# Patient Record
Sex: Male | Born: 1980 | Race: White | Hispanic: No | Marital: Married | Smoking: Never smoker
Health system: Southern US, Community
[De-identification: ages and names within clinical notes are randomized; demographics above are authoritative.]

## PROBLEM LIST (undated history)

## (undated) DIAGNOSIS — IMO0002 Reserved for concepts with insufficient information to code with codable children: Secondary | ICD-10-CM

## (undated) DIAGNOSIS — I10 Essential (primary) hypertension: Secondary | ICD-10-CM

## (undated) HISTORY — PX: CERVICAL FUSION: SHX112

## (undated) HISTORY — PX: OTHER SURGICAL HISTORY: SHX169

## (undated) HISTORY — PX: WISDOM TOOTH EXTRACTION: SHX21

## (undated) HISTORY — PX: TONSILLECTOMY: SUR1361

---

## 2004-09-05 ENCOUNTER — Ambulatory Visit (HOSPITAL_COMMUNITY): Admission: RE | Admit: 2004-09-05 | Discharge: 2004-09-05 | Payer: Self-pay | Admitting: Family Medicine

## 2004-09-16 ENCOUNTER — Ambulatory Visit (HOSPITAL_COMMUNITY): Admission: RE | Admit: 2004-09-16 | Discharge: 2004-09-17 | Payer: Self-pay | Admitting: Neurosurgery

## 2008-06-01 ENCOUNTER — Ambulatory Visit (HOSPITAL_COMMUNITY): Admission: RE | Admit: 2008-06-01 | Discharge: 2008-06-01 | Payer: Self-pay | Admitting: Family Medicine

## 2008-06-11 ENCOUNTER — Ambulatory Visit (HOSPITAL_COMMUNITY): Admission: RE | Admit: 2008-06-11 | Discharge: 2008-06-11 | Payer: Self-pay | Admitting: Family Medicine

## 2010-05-27 NOTE — Op Note (Signed)
Isaiah, Hicks NO.:  [id number]   MEDICAL RECORD NO.:  [id number]          PATIENT TYPE:  OIB   LOCATION:  3017                         FACILITY:  [facility]   PHYSICIAN:  Danae Orleans. Venetia Maxon, M.D.  DATE OF BIRTH:  09-07-1980   DATE OF PROCEDURE:  09/16/2004  DATE OF DISCHARGE:                                 OPERATIVE REPORT   PREOPERATIVE DIAGNOSIS:  Herniated cervical disc with cervical spondylosis,  cervical myelopathy, cervical stenosis, and cervical radiculopathy, C5-C6  level.   POSTOPERATIVE DIAGNOSIS:  Herniated cervical disc with cervical spondylosis,  cervical myelopathy, cervical stenosis, and cervical radiculopathy, C5-C6  level.   PROCEDURE:  Anterior cervical decompression and fusion C5-C6 with PEEK  interbody cage and morselized bone autograft with anterior cervical plate.   SURGEON:  Danae Orleans. Venetia Maxon, M.D.   ASSISTANT:  Stefani Dama, M.D.   ANESTHESIA:  General endotracheal anesthesia.   ESTIMATED BLOOD LOSS:  Minimal.   COMPLICATIONS:  None.   DISPOSITION:  Recovery room.   INDICATIONS FOR PROCEDURE:  Isaiah Hicks is a 30 year old young man with  significant cervical disc herniation at C5-C6 with a right sided cord  compression and C6 nerve root symptoms.  He has significant cervical  stenosis at this level with cord compression.  He has hand intrinsic  weakness and evidence of a myelopathy on examination.  It was elected to  take him to surgery for anterior cervical decompression and fusion at the  affected level.   DESCRIPTION OF PROCEDURE:  Isaiah Hicks was brought to the operating room.  Following satisfactory uncomplicated induction of general endotracheal  anesthesia and placement of intravenous  lines, the patient was placed in a  supine position on the operating table.  His neck was placed in slight  extension.  He was placed in 10 pounds of halter traction.  His anterior  neck was then prepped and draped in the usual  sterile fashion.  The area of  planned incision was infiltrated with 0.25% Marcaine and 0.5% lidocaine with  1:200,000 epinephrine.  An incision was made from the midline to the  anterior border of the sternocleidomastoid muscle and carried sharply  through the platysmal layer.  Subplatysmal dissection was performed exposing  the anterior border of the sternocleidomastoid muscle using blunt  dissection.  The carotid sheath was kept lateral, trachea and esophagus kept  medial, and the anterior cervical spine was identified.  A bent spinal  needle was placed in what was felt to be the C5-C6 level and this was  confirmed on interoperative x-ray.  Subsequently, fairly large ventral  osteophytes were removed with a Leksell rongeur.  The interspace was incised  with a 15 blade and disc material was removed in a piecemeal fashion.  Prior  to doing so, the longus colli muscles were taken down from the anterior  cervical spine from the C5 through C6 levels bilaterally using  electrocautery and Key elevator and a self-retaining Shadowline retractor  was placed to facilitate exposure.  The disc space spreader was placed and  the microscope was brought onto the field.  Using high powered  microscopic  visualization, the endplates of C5 and C6 were decorticated using a high  speed drill and large osteophytes were removed with a variety of 2 and 3 mm  Kerrison rongeurs.  There was a soft disc herniation on the right which was  causing compression of the spinal cord and deflecting the posterior  longitudinal ligament into the spinal cord dura.  This was decompressed with  resulting significant decompression of the C6 nerve root, neural foramen,  and the spinal cord dura on the right side.  A similar decompression was  performed to the left side of midline with resulting decompression of the  spinal cord dura on this side, as well.  Hemostasis was obtained with  Gelfoam soaked in thrombin.  After trial  sizing of the 7 mm trial spacer, a  7 mm PEEK interbody cage was selected, packed with morselized bone  autograft, which was obtained from the drillings of the endplates, inserted  into the interspace, and counter sunk appropriately.  Subsequently, a 14 mm  anterior cervical plate was selected and affixed to the anterior cervical  spine using 14 mm variable angle screws, two at C5 and two at C6.  All  screws had excellent purchase.  Prior to placing the plate, the traction  weight was removed.  The locking mechanisms were engaged.  The final x-ray  demonstrated well positioned interbody cage and anterior cervical plate.  The soft tissues were inspected and found to be in good repair.  Hemostasis  was assured.  The wound was copiously irrigated with Bacitracin saline.  The  platysmal layer was then closed with 3-0 Vicryl sutures and the skin edges  were reapproximated with interrupted 3-0 Vicryl subcuticular stitch.  The  wound was dressed with Dermabond.  The patient was extubated in the  operating room and taken to the recovery room in stable condition having  tolerated the operation well.  Counts were correct at the end of the case.      Danae Orleans. Venetia Maxon, M.D.  Electronically Signed     JDS/MEDQ  D:  09/16/2004  T:  09/16/2004  Job:  [id number]

## 2010-08-08 ENCOUNTER — Emergency Department (HOSPITAL_COMMUNITY)
Admission: EM | Admit: 2010-08-08 | Discharge: 2010-08-08 | Disposition: A | Discharge status: Home / Self Care | Payer: 59 | Attending: Emergency Medicine | Admitting: Emergency Medicine

## 2010-08-08 ENCOUNTER — Emergency Department (HOSPITAL_COMMUNITY): Payer: 59

## 2010-08-08 DIAGNOSIS — N5089 Other specified disorders of the male genital organs: Secondary | ICD-10-CM | POA: Insufficient documentation

## 2010-08-08 DIAGNOSIS — N509 Disorder of male genital organs, unspecified: Secondary | ICD-10-CM | POA: Insufficient documentation

## 2010-08-08 DIAGNOSIS — N453 Epididymo-orchitis: Secondary | ICD-10-CM | POA: Insufficient documentation

## 2010-08-08 LAB — URINALYSIS, ROUTINE W REFLEX MICROSCOPIC
Glucose, UA: NEGATIVE mg/dL
Hgb urine dipstick: NEGATIVE
Ketones, ur: NEGATIVE mg/dL
Leukocytes, UA: NEGATIVE
Nitrite: NEGATIVE
Urobilinogen, UA: 1 mg/dL (ref 0.0–1.0)

## 2010-08-09 LAB — URINE CULTURE

## 2011-10-14 ENCOUNTER — Emergency Department (HOSPITAL_COMMUNITY)
Admission: EM | Admit: 2011-10-14 | Discharge: 2011-10-14 | Disposition: A | Discharge status: Home / Self Care | Payer: 59 | Attending: Emergency Medicine | Admitting: Emergency Medicine

## 2011-10-14 ENCOUNTER — Encounter (HOSPITAL_COMMUNITY): Payer: Self-pay | Admitting: *Deleted

## 2011-10-14 DIAGNOSIS — Z981 Arthrodesis status: Secondary | ICD-10-CM | POA: Insufficient documentation

## 2011-10-14 DIAGNOSIS — M545 Low back pain, unspecified: Secondary | ICD-10-CM | POA: Insufficient documentation

## 2011-10-14 DIAGNOSIS — Z888 Allergy status to other drugs, medicaments and biological substances status: Secondary | ICD-10-CM | POA: Insufficient documentation

## 2011-10-14 HISTORY — DX: Reserved for concepts with insufficient information to code with codable children: IMO0002

## 2011-10-14 MED ORDER — PREDNISONE 20 MG PO TABS
60.0000 mg | ORAL_TABLET | Freq: Once | ORAL | Status: AC
  Administered 2011-10-14: 60 mg via ORAL
  Filled 2011-10-14: qty 3

## 2011-10-14 MED ORDER — OXYCODONE-ACETAMINOPHEN 5-325 MG PO TABS: ORAL_TABLET | ORAL | Status: DC

## 2011-10-14 MED ORDER — OXYCODONE-ACETAMINOPHEN 5-325 MG PO TABS
1.0000 | ORAL_TABLET | Freq: Once | ORAL | Status: AC
  Administered 2011-10-14: 1 via ORAL
  Filled 2011-10-14: qty 1

## 2011-10-14 MED ORDER — PREDNISONE 50 MG PO TABS: 50.0000 mg | ORAL_TABLET | Freq: Every day | ORAL | Status: DC

## 2011-10-14 NOTE — ED Provider Notes (Signed)
History     CSN: [id number]  Arrival date & time 10/14/11  1943   First MD Initiated Contact with Patient 10/14/11 2028      Chief Complaint  Patient presents with  . Back Pain    (Consider location/radiation/quality/duration/timing/severity/associated sxs/prior treatment) HPI Comments: Pt was bending over and sneezed.  He had an immediate sharp pain in the R lower back that radiates to R buttock.  Called [facility] b/c he is a pt of dr. Ethelene Hal. Dr. Shon Baton returned the call and told him to come to the ED for pain meds and they would see him in the office on Monday.  Patient is a 31 y.o. male presenting with back pain. The history is provided by the patient. No language interpreter was used.  Back Pain  This is a chronic problem. The current episode started less than 1 hour ago. The problem occurs constantly. The problem has not changed since onset.The pain is present in the lumbar spine and gluteal region. The quality of the pain is described as shooting. The pain is severe. The symptoms are aggravated by bending and twisting. The pain is the same all the time. Pertinent negatives include no fever, no numbness, no bowel incontinence, no perianal numbness, no bladder incontinence, no dysuria, no pelvic pain, no leg pain, no paresthesias, no paresis, no tingling and no weakness.    Past Medical History  Diagnosis Date  . Degenerative disc disease     Past Surgical History  Procedure Date  . C5-6 fusin   . Cervical fusion     c 5 and c 6    History reviewed. No pertinent family history.  History  Substance Use Topics  . Smoking status: Never Smoker   . Smokeless tobacco: Not on file  . Alcohol Use: No      Review of Systems  Constitutional: Negative for fever and chills.  Gastrointestinal: Negative for bowel incontinence.  Genitourinary: Negative for bladder incontinence, dysuria and pelvic pain.  Musculoskeletal: Positive for back pain.  Neurological: Negative for  tingling, weakness, numbness and paresthesias.  All other systems reviewed and are negative.    Allergies  Zithranol  Home Medications   Current Outpatient Rx  Name Route Sig Dispense Refill  . OXYCODONE-ACETAMINOPHEN 5-325 MG PO TABS  One tab po q 4-6 hrs prn pain 15 tablet 0  . PREDNISONE 50 MG PO TABS Oral Take 1 tablet (50 mg total) by mouth daily. 5 tablet 0    BP 139/78  Pulse 75  Temp 98 F (36.7 C) (Oral)  Resp 20  Ht 6' (1.829 m)  Wt 220 lb (99.791 kg)  BMI 29.84 kg/m2  SpO2 100%  Physical Exam  Nursing note and vitals reviewed. Constitutional: He is oriented to person, place, and time. He appears well-developed and well-nourished.  HENT:  Head: Normocephalic and atraumatic.  Eyes: EOM are normal.  Neck: Normal range of motion.  Cardiovascular: Normal rate, regular rhythm, normal heart sounds and intact distal pulses.   Pulmonary/Chest: Effort normal and breath sounds normal. No respiratory distress.  Abdominal: Soft. He exhibits no distension. There is no tenderness.  Musculoskeletal: He exhibits tenderness.       Lumbar back: He exhibits decreased range of motion, tenderness and pain. He exhibits no bony tenderness.       Back:  Neurological: He is alert and oriented to person, place, and time.  Skin: Skin is warm and dry.  Psychiatric: He has a normal mood and affect. Judgment  normal.    ED Course  Procedures (including critical care time)  Labs Reviewed - No data to display No results found.   1. Acute lumbar back pain       MDM  rx-percocet, 15 rx-prednisone 50 mg x 5 days. F/u with dr. Johnney Killian, PA 10/14/11 2051

## 2011-10-14 NOTE — ED Notes (Signed)
Pt presents with chronic low back pain, worsening in past 2 days. Pt denies recent injury. Denies bowel/bladder dysfunction. Pt has appointment with PCP on Monday.

## 2011-10-14 NOTE — ED Notes (Addendum)
Pt bent over to pick up shampoo bottle and sneezed which caused lower back pain. Pt spoke with Dr. Shon Baton, his on-call orthopedist for Dr. Ethelene Hal. They instructed him to go to an urgent care for pain relief until he can see them on Monday.

## 2011-10-15 NOTE — ED Provider Notes (Signed)
Medical screening examination/treatment/procedure(s) were performed by non-physician practitioner and as supervising physician I was immediately available for consultation/collaboration.   Glender Augusta L Marquesa Rath, MD 10/15/11 0748 

## 2013-10-23 ENCOUNTER — Ambulatory Visit: Payer: Self-pay | Admitting: Podiatry

## 2013-12-22 ENCOUNTER — Telehealth: Payer: Self-pay | Admitting: Family Medicine

## 2013-12-22 NOTE — Telephone Encounter (Signed)
Patient not seen here in over 2 years

## 2013-12-22 NOTE — Telephone Encounter (Deleted)
Yes 10am per Dr Lorin PicketScott

## 2013-12-22 NOTE — Telephone Encounter (Signed)
tues am rolling 100 mL

## 2013-12-22 NOTE — Telephone Encounter (Signed)
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

## 2013-12-22 NOTE — Telephone Encounter (Signed)
appt for Tuesday AM?

## 2013-12-22 NOTE — Telephone Encounter (Addendum)
Appt for Friday am per Dr Lorin PicketScott

## 2013-12-22 NOTE — Telephone Encounter (Signed)
appt made

## 2013-12-26 ENCOUNTER — Ambulatory Visit (INDEPENDENT_AMBULATORY_CARE_PROVIDER_SITE_OTHER): Payer: 59 | Admitting: Family Medicine

## 2013-12-26 ENCOUNTER — Encounter: Payer: Self-pay | Admitting: Family Medicine

## 2013-12-26 VITALS — BP 136/84 | HR 76 | Ht 72.0 in | Wt 243.0 lb

## 2013-12-26 DIAGNOSIS — Z111 Encounter for screening for respiratory tuberculosis: Secondary | ICD-10-CM

## 2013-12-26 DIAGNOSIS — Z0000 Encounter for general adult medical examination without abnormal findings: Secondary | ICD-10-CM

## 2013-12-26 NOTE — Progress Notes (Signed)
   Subjective:    Patient ID: Isaiah Hicks, male    DOB: 07/12/1980, 33 y.o.   MRN: [id number]  HPI The patient comes in today for a wellness visit.  A review of their health history was completed.  A review of medications was also completed.  Any needed refills: NO  Eating habits: Sometimes tries to be health conscious  Falls/  MVA accidents in past few months: NO  Regular exercise: Some  Specialist pt sees on regular basis: NO  Preventative health issues were discussed.   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 I do not feel this patient needs any additional testing.  TB test was applied  Patient was cautioned to eat healthy stay physically active try to bring his weight down he was also recommended to check his cholesterol and sugar in the near future

## 2013-12-26 NOTE — Addendum Note (Signed)
Addended byOneal Deputy: Taylan Marez D on: 12/26/2013 03:16 PM   Modules accepted: Orders

## 2013-12-29 ENCOUNTER — Other Ambulatory Visit: Payer: Self-pay | Admitting: *Deleted

## 2013-12-29 ENCOUNTER — Ambulatory Visit (HOSPITAL_COMMUNITY)
Admission: RE | Admit: 2013-12-29 | Discharge: 2013-12-29 | Disposition: A | Discharge status: Home / Self Care | Payer: 59 | Source: Ambulatory Visit | Attending: Nurse Practitioner | Admitting: Nurse Practitioner

## 2013-12-29 DIAGNOSIS — Z87891 Personal history of nicotine dependence: Secondary | ICD-10-CM | POA: Diagnosis not present

## 2013-12-29 DIAGNOSIS — R05 Cough: Secondary | ICD-10-CM | POA: Insufficient documentation

## 2013-12-29 DIAGNOSIS — R7611 Nonspecific reaction to tuberculin skin test without active tuberculosis: Secondary | ICD-10-CM | POA: Diagnosis present

## 2013-12-29 DIAGNOSIS — R6889 Other general symptoms and signs: Secondary | ICD-10-CM | POA: Insufficient documentation

## 2013-12-29 DIAGNOSIS — Z111 Encounter for screening for respiratory tuberculosis: Secondary | ICD-10-CM

## 2013-12-29 LAB — TB SKIN TEST: TB Skin Test: POSITIVE

## 2013-12-30 ENCOUNTER — Encounter: Payer: Self-pay | Admitting: Family Medicine

## 2013-12-30 LAB — LIPID PANEL
CHOL/HDL RATIO: 5.8 ratio
CHOLESTEROL: 216 mg/dL — AB (ref 0–200)
HDL: 37 mg/dL — ABNORMAL LOW (ref >39)
LDL Cholesterol: 151 mg/dL — ABNORMAL HIGH (ref 0–99)
TRIGLYCERIDES: 138 mg/dL (ref <150)
VLDL: 28 mg/dL (ref 0–40)

## 2013-12-30 LAB — GLUCOSE, RANDOM: GLUCOSE: 92 mg/dL (ref 70–99)

## 2014-09-18 ENCOUNTER — Encounter: Payer: Self-pay | Admitting: Family Medicine

## 2014-09-18 ENCOUNTER — Ambulatory Visit (INDEPENDENT_AMBULATORY_CARE_PROVIDER_SITE_OTHER): Payer: 59 | Admitting: Family Medicine

## 2014-09-18 VITALS — BP 128/82 | Ht 72.0 in | Wt 242.6 lb

## 2014-09-18 DIAGNOSIS — M5441 Lumbago with sciatica, right side: Secondary | ICD-10-CM

## 2014-09-18 NOTE — Progress Notes (Signed)
   Subjective:    Patient ID: Isaiah Hicks, male    DOB: 19-Nov-1980, 34 y.o.   MRN: [id number]  HPI Patient arrives with c/o back pain for years currently using mobic. Patient relates significant lower right back pain radiates into the right leg this been going on for good couple years progressively worse over the past 4 months he is try stretching exercises core exercises as well as anti-inflammatory without any success. He is also seen orthopedic doctor who did plain x-rays told him he had degenerative disc disease and multiple injections which did not help. Patient states the pain is making difficult for him to work. He also relates the pain wakes him up at night as well.  Patient also relates groin pain on the left side which is intense aches goes from the rectum into the lower pelvis testicular region. He states that he saw the urologist and evaluation of alignment told him that he did not feel it was due to any urologic issues felt that it was due to an impingement nerve in his back  Patient does have a history of herniated disc in his neck  Review of Systems He relates burning pain discomfort down the right leg intermittent numbness tingling. Relates low back pain on right side denies abdominal pain vomiting diarrhea chest pain or shortness breath    Objective:   Physical Exam  Lungs clear heart trigger pulse normal abdomen soft positive straight leg raise on the right negative on the left reflexes hyperreflexia on the right normal on the left      Assessment & Plan:  Given that this gentleman is had discomfort for over a year seems to be getting worse has failed conservative measures including stretching exercises core exercises and anti-inflammatory .alsobefore meals failed injections.in addition to this his pain is getting worse interfere and causing him to wake up frequently at night. I believe that it is reasonable for the patient have a MRI of his lower spine.await the results  of the MRI in the meantime uses [facility] daily

## 2014-09-29 ENCOUNTER — Ambulatory Visit (HOSPITAL_COMMUNITY)
Admission: RE | Admit: 2014-09-29 | Discharge: 2014-09-29 | Disposition: A | Discharge status: Home / Self Care | Payer: 59 | Source: Ambulatory Visit | Attending: Family Medicine | Admitting: Family Medicine

## 2014-09-29 ENCOUNTER — Ambulatory Visit (HOSPITAL_COMMUNITY): Admission: RE | Admit: 2014-09-29 | Payer: 59 | Source: Ambulatory Visit

## 2014-09-29 DIAGNOSIS — M545 Low back pain: Secondary | ICD-10-CM | POA: Insufficient documentation

## 2014-09-29 DIAGNOSIS — M5126 Other intervertebral disc displacement, lumbar region: Secondary | ICD-10-CM | POA: Insufficient documentation

## 2014-09-29 DIAGNOSIS — M25559 Pain in unspecified hip: Secondary | ICD-10-CM | POA: Diagnosis not present

## 2014-10-08 NOTE — Addendum Note (Signed)
Addended by: Metro Kung on: 10/08/2014 11:43 AM   Modules accepted: Orders

## 2014-11-24 ENCOUNTER — Telehealth: Payer: Self-pay | Admitting: Family Medicine

## 2014-11-24 ENCOUNTER — Other Ambulatory Visit: Payer: Self-pay | Admitting: *Deleted

## 2014-11-24 MED ORDER — SULFACETAMIDE SODIUM 10 % OP SOLN: OPHTHALMIC | Status: DC

## 2014-11-24 NOTE — Telephone Encounter (Signed)
Swelling left eye lid, green drainage and itching. Sulfacetamide ophthalmic eye drop sent to [facility] in [place]. Pt notified to follow up in office if not better.

## 2014-11-24 NOTE — Telephone Encounter (Signed)
Pt is requesting something to be called in for pink eye.   [facility] [address] [place]

## 2014-11-24 NOTE — Telephone Encounter (Signed)
[facility] to get symptoms 

## 2014-11-24 NOTE — Telephone Encounter (Signed)
Pt is wanting a generic called in.

## 2014-11-30 ENCOUNTER — Encounter: Payer: Self-pay | Admitting: Family Medicine

## 2014-12-17 DIAGNOSIS — M5136 Other intervertebral disc degeneration, lumbar region: Secondary | ICD-10-CM | POA: Insufficient documentation

## 2015-03-25 DIAGNOSIS — M47816 Spondylosis without myelopathy or radiculopathy, lumbar region: Secondary | ICD-10-CM | POA: Insufficient documentation

## 2015-05-25 ENCOUNTER — Encounter: Payer: Self-pay | Admitting: Nurse Practitioner

## 2015-05-25 ENCOUNTER — Ambulatory Visit (INDEPENDENT_AMBULATORY_CARE_PROVIDER_SITE_OTHER): Payer: 59 | Admitting: Nurse Practitioner

## 2015-05-25 VITALS — BP 134/86 | Ht 72.0 in | Wt 248.0 lb

## 2015-05-25 DIAGNOSIS — E785 Hyperlipidemia, unspecified: Secondary | ICD-10-CM

## 2015-05-25 DIAGNOSIS — Z139 Encounter for screening, unspecified: Secondary | ICD-10-CM

## 2015-05-25 NOTE — Progress Notes (Signed)
Subjective:  Presents for recheck on his blood pressure and cholesterol. Had biometric scanning done by fingerstick [place] job [place] [employer] Was told his cholesterol was abnormal, does not have results today. No known family history of heart disease the patient knows little about his grandparents. Has a very active job doing a lot of walking. Does not do well with his diet. Drinks a lot of regular soda. Also the county provides meals during the day shift which are not very healthy. Does not add any sodium to his food. No chest pain/ischemic type pain or shortness of breath.  Objective:   BP 134/86 mmHg  Ht 6' (1.829 m)  Wt 248 lb (112.492 kg)  BMI 33.63 kg/m2 NAD. Alert, oriented. Lungs clear. Heart regular rate rhythm. No murmur or gallop noted. BP on recheck right arm sitting 136/88. Lower extremities no edema.  Assessment:  Problem List Items Addressed This Visit      Other   Hyperlipidemia - Primary   Relevant Orders   Lipid panel   Hepatic function panel    Other Visit Diagnoses    Screening        Relevant Orders    Basic metabolic panel      Plan: Explained that currently his BP is running in the prehypertensive stage. Lengthy discussion regarding lifestyle measures to help his cholesterol and blood pressure. Encourage weight loss. Cut out regular sodas. Lab work pending. Call back if BP is consistently over 140/90 outside the office.

## 2015-06-03 LAB — LIPID PANEL
CHOL/HDL RATIO: 5.3 ratio — AB (ref 0.0–5.0)
Cholesterol, Total: 191 mg/dL (ref 100–199)
HDL: 36 mg/dL — ABNORMAL LOW (ref >39)
LDL CALC: 130 mg/dL — AB (ref 0–99)
TRIGLYCERIDES: 125 mg/dL (ref 0–149)
VLDL Cholesterol Cal: 25 mg/dL (ref 5–40)

## 2015-06-03 LAB — HEPATIC FUNCTION PANEL
ALBUMIN: 4.3 g/dL (ref 3.5–5.5)
ALT: 62 IU/L — AB (ref 0–44)
AST: 39 IU/L (ref 0–40)
Alkaline Phosphatase: 70 IU/L (ref 39–117)
BILIRUBIN TOTAL: 0.9 mg/dL (ref 0.0–1.2)
BILIRUBIN, DIRECT: 0.17 mg/dL (ref 0.00–0.40)
TOTAL PROTEIN: 6.8 g/dL (ref 6.0–8.5)

## 2015-06-03 LAB — BASIC METABOLIC PANEL
BUN / CREAT RATIO: 11 (ref 9–20)
BUN: 12 mg/dL (ref 6–20)
CALCIUM: 9.5 mg/dL (ref 8.7–10.2)
CHLORIDE: 102 mmol/L (ref 96–106)
CO2: 21 mmol/L (ref 18–29)
CREATININE: 1.06 mg/dL (ref 0.76–1.27)
GFR, EST AFRICAN AMERICAN: 105 mL/min/{1.73_m2} (ref >59)
GFR, EST NON AFRICAN AMERICAN: 90 mL/min/{1.73_m2} (ref >59)
Glucose: 89 mg/dL (ref 65–99)
Potassium: 5 mmol/L (ref 3.5–5.2)
Sodium: 141 mmol/L (ref 134–144)

## 2015-12-01 ENCOUNTER — Ambulatory Visit (INDEPENDENT_AMBULATORY_CARE_PROVIDER_SITE_OTHER): Payer: 59 | Admitting: Family Medicine

## 2015-12-01 ENCOUNTER — Encounter: Payer: Self-pay | Admitting: Family Medicine

## 2015-12-01 VITALS — BP 132/86 | Ht 72.0 in | Wt 250.6 lb

## 2015-12-01 DIAGNOSIS — Z0000 Encounter for general adult medical examination without abnormal findings: Secondary | ICD-10-CM | POA: Diagnosis not present

## 2015-12-01 DIAGNOSIS — J019 Acute sinusitis, unspecified: Secondary | ICD-10-CM | POA: Diagnosis not present

## 2015-12-01 DIAGNOSIS — Z111 Encounter for screening for respiratory tuberculosis: Secondary | ICD-10-CM

## 2015-12-01 DIAGNOSIS — B9689 Other specified bacterial agents as the cause of diseases classified elsewhere: Secondary | ICD-10-CM

## 2015-12-01 MED ORDER — AMOXICILLIN 500 MG PO TABS: 500.0000 mg | ORAL_TABLET | Freq: Three times a day (TID) | ORAL | 0 refills | Status: DC

## 2015-12-01 NOTE — Progress Notes (Signed)
Subjective:    Patient ID: Isaiah Hicks, male    DOB: 07/23/1980, 35 y.o.   MRN: [id number]  HPI  The patient comes in today for a wellness visit. Patient works in the [employer]. He does 12-16 hour shifts. Sometimes works overtime. Denies any chest pressure shortness breath with activity. Wears a seatbelt tries to be safe does not smoke. Tries to eat well but he states he knows he needs to lose weight. Patients had a recent cold head congestion drainage coughing over the past 2 weeks no high fever chills  A review of their health history was completed.  A review of medications was also completed.  Any needed refills; none  Eating habits: somewhat healthy  Falls/  MVA accidents in past few months: none  Regular exercise: no  Specialist pt sees on regular basis: [facility]  Preventative health issues were discussed.   Additional concerns: none  Patient does have a history of cervical and lumbar back pain he does see a specialist for this  Review of Systems  Constitutional: Negative for activity change, appetite change and fever.  HENT: Negative for congestion and rhinorrhea.   Eyes: Negative for discharge.  Respiratory: Negative for cough and wheezing.   Cardiovascular: Negative for chest pain.  Gastrointestinal: Negative for abdominal pain, blood in stool and vomiting.  Genitourinary: Negative for difficulty urinating and frequency.  Musculoskeletal: Negative for neck pain.  Skin: Negative for rash.  Allergic/Immunologic: Negative for environmental allergies and food allergies.  Neurological: Negative for weakness and headaches.  Psychiatric/Behavioral: Negative for agitation.       Objective:   Physical Exam  Constitutional: He appears well-developed and well-nourished.  HENT:  Head: Normocephalic and atraumatic.  Right Ear: External ear normal.  Left Ear: External ear normal.  Nose: Nose normal.  Mouth/Throat: Oropharynx is clear and moist.  Eyes: EOM are  normal. Pupils are equal, round, and reactive to light.  Neck: Normal range of motion. Neck supple. No thyromegaly present.  Cardiovascular: Normal rate, regular rhythm and normal heart sounds.   No murmur heard. Pulmonary/Chest: Effort normal and breath sounds normal. No respiratory distress. He has no wheezes.  Abdominal: Soft. Bowel sounds are normal. He exhibits no distension and no mass. There is no tenderness.  Genitourinary: Penis normal.  Musculoskeletal: Normal range of motion. He exhibits no edema.  Lymphadenopathy:    He has no cervical adenopathy.  Neurological: He is alert. He exhibits normal muscle tone.  Skin: Skin is warm and dry. No erythema.  Psychiatric: He has a normal mood and affect. His behavior is normal. Judgment normal.          Assessment & Plan:  Adult wellness-complete.wellness physical was conducted today. Importance of diet and exercise were discussed in detail. In addition to this a discussion regarding safety was also covered. We also reviewed over immunizations and gave recommendations regarding current immunization needed for age. In addition to this additional areas were also touched on including: Preventative health exams needed: Colonoscopy not indicated Prostate exam not indicated  Viral illness with secondary rhinosinusitis antibiotic prescribed warning signs discussed follow-up if problems Lab work from earlier this year reviewed. 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 I find no restrictions whatsoever. TB test pending patient states in the past TB test required a follow-up chest x-ray I told him we would do the TB test then check in on Friday then  determine if it needs a follow-up

## 2015-12-01 NOTE — Patient Instructions (Signed)
DASH Eating Plan DASH stands for "Dietary Approaches to Stop Hypertension." The DASH eating plan is a healthy eating plan that has been shown to reduce high blood pressure (hypertension). Additional health benefits may include reducing the risk of type 2 diabetes mellitus, heart disease, and stroke. The DASH eating plan may also help with weight loss. What do I need to know about the DASH eating plan? For the DASH eating plan, you will follow these general guidelines:  Choose foods with less than 150 milligrams of sodium per serving (as listed on the food label).  Use salt-free seasonings or herbs instead of table salt or sea salt.  Check with your health care provider or pharmacist before using salt substitutes.  Eat lower-sodium products. These are often labeled as "low-sodium" or "no salt added."  Eat fresh foods. Avoid eating a lot of canned foods.  Eat more vegetables, fruits, and low-fat dairy products.  Choose whole grains. Look for the word "whole" as the first word in the ingredient list.  Choose fish and skinless chicken or turkey more often than red meat. Limit fish, poultry, and meat to 6 oz (170 g) each day.  Limit sweets, desserts, sugars, and sugary drinks.  Choose heart-healthy fats.  Eat more home-cooked food and less restaurant, buffet, and fast food.  Limit fried foods.  Do not fry foods. Cook foods using methods such as baking, boiling, grilling, and broiling instead.  When eating at a restaurant, ask that your food be prepared with less salt, or no salt if possible. What foods can I eat? Seek help from a dietitian for individual calorie needs. Grains  Whole grain or whole wheat bread. Brown rice. Whole grain or whole wheat pasta. Quinoa, bulgur, and whole grain cereals. Low-sodium cereals. Corn or whole wheat flour tortillas. Whole grain cornbread. Whole grain crackers. Low-sodium crackers. Vegetables  Fresh or frozen vegetables (raw, steamed, roasted, or  grilled). Low-sodium or reduced-sodium tomato and vegetable juices. Low-sodium or reduced-sodium tomato sauce and paste. Low-sodium or reduced-sodium canned vegetables. Fruits  All fresh, canned (in natural juice), or frozen fruits. Meat and Other Protein Products  Ground beef (85% or leaner), grass-fed beef, or beef trimmed of fat. Skinless chicken or turkey. Ground chicken or turkey. Pork trimmed of fat. All fish and seafood. Eggs. Dried beans, peas, or lentils. Unsalted nuts and seeds. Unsalted canned beans. Dairy  Low-fat dairy products, such as skim or 1% milk, 2% or reduced-fat cheeses, low-fat ricotta or cottage cheese, or plain low-fat yogurt. Low-sodium or reduced-sodium cheeses. Fats and Oils  Tub margarines without trans fats. Light or reduced-fat mayonnaise and salad dressings (reduced sodium). Avocado. Safflower, olive, or canola oils. Natural peanut or almond butter. Other  Unsalted popcorn and pretzels. The items listed above may not be a complete list of recommended foods or beverages. Contact your dietitian for more options.  What foods are not recommended? Grains  White bread. White pasta. White rice. Refined cornbread. Bagels and croissants. Crackers that contain trans fat. Vegetables  Creamed or fried vegetables. Vegetables in a cheese sauce. Regular canned vegetables. Regular canned tomato sauce and paste. Regular tomato and vegetable juices. Fruits  Canned fruit in light or heavy syrup. Fruit juice. Meat and Other Protein Products  Fatty cuts of meat. Ribs, chicken wings, bacon, sausage, bologna, salami, chitterlings, fatback, hot dogs, bratwurst, and packaged luncheon meats. Salted nuts and seeds. Canned beans with salt. Dairy  Whole or 2% milk, cream, half-and-half, and cream cheese. Whole-fat or sweetened yogurt. Full-fat cheeses   or blue cheese. Nondairy creamers and whipped toppings. Processed cheese, cheese spreads, or cheese curds. Condiments  Onion and garlic  salt, seasoned salt, table salt, and sea salt. Canned and packaged gravies. Worcestershire sauce. Tartar sauce. Barbecue sauce. Teriyaki sauce. Soy sauce, including reduced sodium. Steak sauce. Fish sauce. Oyster sauce. Cocktail sauce. Horseradish. Ketchup and mustard. Meat flavorings and tenderizers. Bouillon cubes. Hot sauce. Tabasco sauce. Marinades. Taco seasonings. Relishes. Fats and Oils  Butter, stick margarine, lard, shortening, ghee, and bacon fat. Coconut, palm kernel, or palm oils. Regular salad dressings. Other  Pickles and olives. Salted popcorn and pretzels. The items listed above may not be a complete list of foods and beverages to avoid. Contact your dietitian for more information.  Where can I find more information? National Heart, Lung, and Blood Institute: www.nhlbi.nih.gov/health/health-topics/topics/dash/ This information is not intended to replace advice given to you by your health care provider. Make sure you discuss any questions you have with your health care provider. Document Released: 12/15/2010 Document Revised: 06/03/2015 Document Reviewed: 10/30/2012 Elsevier Interactive Patient Education  2017 Elsevier Inc.  

## 2015-12-08 ENCOUNTER — Ambulatory Visit (INDEPENDENT_AMBULATORY_CARE_PROVIDER_SITE_OTHER): Payer: 59 | Admitting: *Deleted

## 2015-12-08 DIAGNOSIS — Z111 Encounter for screening for respiratory tuberculosis: Secondary | ICD-10-CM | POA: Diagnosis not present

## 2015-12-10 LAB — TB SKIN TEST
INDURATION: 0 mm
TB SKIN TEST: NEGATIVE

## 2016-01-24 ENCOUNTER — Telehealth: Payer: Self-pay | Admitting: Family Medicine

## 2016-01-24 NOTE — Telephone Encounter (Signed)
Please advise 

## 2016-01-24 NOTE — Telephone Encounter (Signed)
Warm compresses 5-10 minutes at a time several times per day, Keflex 500 mg 1 4 times a day for 7 days, if progressive symptoms or if worse follow-up

## 2016-01-24 NOTE — Telephone Encounter (Signed)
Pt has had a stye come up on his eye and is unable to get off work to come in. Pt is wanting to know if something can be called in for this.     Rushie ChestnutWALGREENS

## 2016-01-25 MED ORDER — CEPHALEXIN 500 MG PO CAPS: 500.0000 mg | ORAL_CAPSULE | Freq: Four times a day (QID) | ORAL | 0 refills | Status: DC

## 2016-01-25 NOTE — Telephone Encounter (Signed)
Prescription sent electronically to pharmacy. Patient notified and advised he can do warm compresses 5-10 minutes at a time several times per day. Verbalized understanding.

## 2016-03-21 DIAGNOSIS — L570 Actinic keratosis: Secondary | ICD-10-CM | POA: Diagnosis not present

## 2016-03-21 DIAGNOSIS — B079 Viral wart, unspecified: Secondary | ICD-10-CM | POA: Diagnosis not present

## 2016-03-21 DIAGNOSIS — L219 Seborrheic dermatitis, unspecified: Secondary | ICD-10-CM | POA: Diagnosis not present

## 2016-04-18 DIAGNOSIS — L570 Actinic keratosis: Secondary | ICD-10-CM | POA: Diagnosis not present

## 2016-04-18 DIAGNOSIS — L578 Other skin changes due to chronic exposure to nonionizing radiation: Secondary | ICD-10-CM | POA: Diagnosis not present

## 2016-08-10 DIAGNOSIS — L218 Other seborrheic dermatitis: Secondary | ICD-10-CM | POA: Diagnosis not present

## 2016-08-10 DIAGNOSIS — L718 Other rosacea: Secondary | ICD-10-CM | POA: Diagnosis not present

## 2016-08-10 DIAGNOSIS — L570 Actinic keratosis: Secondary | ICD-10-CM | POA: Diagnosis not present

## 2016-11-22 DIAGNOSIS — L570 Actinic keratosis: Secondary | ICD-10-CM | POA: Diagnosis not present

## 2017-02-14 DIAGNOSIS — L570 Actinic keratosis: Secondary | ICD-10-CM | POA: Diagnosis not present

## 2017-02-17 DIAGNOSIS — Z024 Encounter for examination for driving license: Secondary | ICD-10-CM | POA: Diagnosis not present

## 2017-02-23 ENCOUNTER — Ambulatory Visit: Payer: 59 | Admitting: Family Medicine

## 2017-02-23 ENCOUNTER — Encounter: Payer: Self-pay | Admitting: Family Medicine

## 2017-02-23 VITALS — BP 140/96 | Ht 72.0 in | Wt 260.0 lb

## 2017-02-23 DIAGNOSIS — I10 Essential (primary) hypertension: Secondary | ICD-10-CM | POA: Diagnosis not present

## 2017-02-23 DIAGNOSIS — E7849 Other hyperlipidemia: Secondary | ICD-10-CM

## 2017-02-23 DIAGNOSIS — Z79899 Other long term (current) drug therapy: Secondary | ICD-10-CM

## 2017-02-23 MED ORDER — LISINOPRIL 5 MG PO TABS: 5.0000 mg | ORAL_TABLET | Freq: Every day | ORAL | 3 refills | Status: DC

## 2017-02-23 NOTE — Progress Notes (Signed)
   Subjective:    Patient ID: Isaiah Hicks, male    DOB: 07-02-1980, 37 y.o.   MRN: [id number]  Hyperlipidemia  This is a chronic problem. Pertinent negatives include no chest pain or shortness of breath. Current antihyperlipidemic treatment includes diet change.   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  Patient has a history of elevated blood pressure also has a history of hyperlipidemia he has not been on any medications recently.  He states he does try to watch his diet to some degree but admits he could do better also states he could fit in more exercise but he has a very busy family plus also stays extremely busy with his job  Patient does have moderate overweight issues  Review of Systems  Constitutional: Negative for activity change, appetite change and fatigue.  HENT: Negative for congestion and rhinorrhea.   Respiratory: Negative for cough, chest tightness and shortness of breath.   Cardiovascular: Negative for chest pain and leg swelling.  Gastrointestinal: Negative for abdominal pain, diarrhea and nausea.  Endocrine: Negative for polydipsia and polyphagia.  Genitourinary: Negative for dysuria and hematuria.  Neurological: Positive for headaches. Negative for weakness.  Psychiatric/Behavioral: Negative for confusion and dysphoric mood.       Objective:   Physical Exam  Constitutional: He appears well-nourished. No distress.  HENT:  Head: Normocephalic and atraumatic.  Eyes: Right eye exhibits no discharge. Left eye exhibits no discharge.  Neck: No tracheal deviation present.  Cardiovascular: Normal rate, regular rhythm and normal heart sounds.  No murmur heard. Pulmonary/Chest: Effort normal and breath sounds normal. No respiratory distress. He has no wheezes.  Musculoskeletal: He exhibits no edema.  Lymphadenopathy:    He has no cervical adenopathy.  Neurological: He is alert.  Skin: Skin is warm. No rash noted.  Psychiatric: His behavior is normal.  Vitals  reviewed.         Assessment & Plan:  1. Essential hypertension Blood pressure subpar control start medication medication rationale discussed side effects discussed patient check blood pressure periodically with large blood pressure cuff patient instructed to follow-up with us again in 4-6 weeks - Basic metabolic panel  2. Other hyperlipidemia Hyperlipidemia check lipid profile more than likely had to be on a statin - Lipid panel  Patient encouraged to lose weight exercise and use DASH diet 3. High risk medication use Lab work ordered - Hepatic function panel

## 2017-02-23 NOTE — Patient Instructions (Signed)
DASH Eating Plan DASH stands for "Dietary Approaches to Stop Hypertension." The DASH eating plan is a healthy eating plan that has been shown to reduce high blood pressure (hypertension). It may also reduce your risk for type 2 diabetes, heart disease, and stroke. The DASH eating plan may also help with weight loss. What are tips for following this plan? General guidelines  Avoid eating more than 2,300 mg (milligrams) of salt (sodium) a day. If you have hypertension, you may need to reduce your sodium intake to 1,500 mg a day.  Limit alcohol intake to no more than 1 drink a day for nonpregnant women and 2 drinks a day for men. One drink equals 12 oz of beer, 5 oz of wine, or 1 oz of hard liquor.  Work with your health care provider to maintain a healthy body weight or to lose weight. Ask what an ideal weight is for you.  Get at least 30 minutes of exercise that causes your heart to beat faster (aerobic exercise) most days of the week. Activities may include walking, swimming, or biking.  Work with your health care provider or diet and nutrition specialist (dietitian) to adjust your eating plan to your individual calorie needs. Reading food labels  Check food labels for the amount of sodium per serving. Choose foods with less than 5 percent of the Daily Value of sodium. Generally, foods with less than 300 mg of sodium per serving fit into this eating plan.  To find whole grains, look for the word "whole" as the first word in the ingredient list. Shopping  Buy products labeled as "low-sodium" or "no salt added."  Buy fresh foods. Avoid canned foods and premade or frozen meals. Cooking  Avoid adding salt when cooking. Use salt-free seasonings or herbs instead of table salt or sea salt. Check with your health care provider or pharmacist before using salt substitutes.  Do not fry foods. Cook foods using healthy methods such as baking, boiling, grilling, and broiling instead.  Cook with  heart-healthy oils, such as olive, canola, soybean, or sunflower oil. Meal planning   Eat a balanced diet that includes: ? 5 or more servings of fruits and vegetables each day. At each meal, try to fill half of your plate with fruits and vegetables. ? Up to 6-8 servings of whole grains each day. ? Less than 6 oz of lean meat, poultry, or fish each day. A 3-oz serving of meat is about the same size as a deck of cards. One egg equals 1 oz. ? 2 servings of low-fat dairy each day. ? A serving of nuts, seeds, or beans 5 times each week. ? Heart-healthy fats. Healthy fats called Omega-3 fatty acids are found in foods such as flaxseeds and coldwater fish, like sardines, salmon, and mackerel.  Limit how much you eat of the following: ? Canned or prepackaged foods. ? Food that is high in trans fat, such as fried foods. ? Food that is high in saturated fat, such as fatty meat. ? Sweets, desserts, sugary drinks, and other foods with added sugar. ? Full-fat dairy products.  Do not salt foods before eating.  Try to eat at least 2 vegetarian meals each week.  Eat more home-cooked food and less restaurant, buffet, and fast food.  When eating at a restaurant, ask that your food be prepared with less salt or no salt, if possible. What foods are recommended? The items listed may not be a complete list. Talk with your dietitian about what   dietary choices are best for you. Grains Whole-grain or whole-wheat bread. Whole-grain or whole-wheat pasta. Brown rice. Oatmeal. Quinoa. Bulgur. Whole-grain and low-sodium cereals. Pita bread. Low-fat, low-sodium crackers. Whole-wheat flour tortillas. Vegetables Fresh or frozen vegetables (raw, steamed, roasted, or grilled). Low-sodium or reduced-sodium tomato and vegetable juice. Low-sodium or reduced-sodium tomato sauce and tomato paste. Low-sodium or reduced-sodium canned vegetables. Fruits All fresh, dried, or frozen fruit. Canned fruit in natural juice (without  added sugar). Meat and other protein foods Skinless chicken or turkey. Ground chicken or turkey. Pork with fat trimmed off. Fish and seafood. Egg whites. Dried beans, peas, or lentils. Unsalted nuts, nut butters, and seeds. Unsalted canned beans. Lean cuts of beef with fat trimmed off. Low-sodium, lean deli meat. Dairy Low-fat (1%) or fat-free (skim) milk. Fat-free, low-fat, or reduced-fat cheeses. Nonfat, low-sodium ricotta or cottage cheese. Low-fat or nonfat yogurt. Low-fat, low-sodium cheese. Fats and oils Soft margarine without trans fats. Vegetable oil. Low-fat, reduced-fat, or light mayonnaise and salad dressings (reduced-sodium). Canola, safflower, olive, soybean, and sunflower oils. Avocado. Seasoning and other foods Herbs. Spices. Seasoning mixes without salt. Unsalted popcorn and pretzels. Fat-free sweets. What foods are not recommended? The items listed may not be a complete list. Talk with your dietitian about what dietary choices are best for you. Grains Baked goods made with fat, such as croissants, muffins, or some breads. Dry pasta or rice meal packs. Vegetables Creamed or fried vegetables. Vegetables in a cheese sauce. Regular canned vegetables (not low-sodium or reduced-sodium). Regular canned tomato sauce and paste (not low-sodium or reduced-sodium). Regular tomato and vegetable juice (not low-sodium or reduced-sodium). Pickles. Olives. Fruits Canned fruit in a light or heavy syrup. Fried fruit. Fruit in cream or butter sauce. Meat and other protein foods Fatty cuts of meat. Ribs. Fried meat. Bacon. Sausage. Bologna and other processed lunch meats. Salami. Fatback. Hotdogs. Bratwurst. Salted nuts and seeds. Canned beans with added salt. Canned or smoked fish. Whole eggs or egg yolks. Chicken or turkey with skin. Dairy Whole or 2% milk, cream, and half-and-half. Whole or full-fat cream cheese. Whole-fat or sweetened yogurt. Full-fat cheese. Nondairy creamers. Whipped toppings.  Processed cheese and cheese spreads. Fats and oils Butter. Stick margarine. Lard. Shortening. Ghee. Bacon fat. Tropical oils, such as coconut, palm kernel, or palm oil. Seasoning and other foods Salted popcorn and pretzels. Onion salt, garlic salt, seasoned salt, table salt, and sea salt. Worcestershire sauce. Tartar sauce. Barbecue sauce. Teriyaki sauce. Soy sauce, including reduced-sodium. Steak sauce. Canned and packaged gravies. Fish sauce. Oyster sauce. Cocktail sauce. Horseradish that you find on the shelf. Ketchup. Mustard. Meat flavorings and tenderizers. Bouillon cubes. Hot sauce and Tabasco sauce. Premade or packaged marinades. Premade or packaged taco seasonings. Relishes. Regular salad dressings. Where to find more information:  National Heart, Lung, and Blood Institute: www.nhlbi.nih.gov  American Heart Association: www.heart.org Summary  The DASH eating plan is a healthy eating plan that has been shown to reduce high blood pressure (hypertension). It may also reduce your risk for type 2 diabetes, heart disease, and stroke.  With the DASH eating plan, you should limit salt (sodium) intake to 2,300 mg a day. If you have hypertension, you may need to reduce your sodium intake to 1,500 mg a day.  When on the DASH eating plan, aim to eat more fresh fruits and vegetables, whole grains, lean proteins, low-fat dairy, and heart-healthy fats.  Work with your health care provider or diet and nutrition specialist (dietitian) to adjust your eating plan to your individual   calorie needs. This information is not intended to replace advice given to you by your health care provider. Make sure you discuss any questions you have with your health care provider. Document Released: 12/15/2010 Document Revised: 12/20/2015 Document Reviewed: 12/20/2015 Elsevier Interactive Patient Education  2018 Elsevier Inc.  

## 2017-03-09 DIAGNOSIS — E7849 Other hyperlipidemia: Secondary | ICD-10-CM | POA: Diagnosis not present

## 2017-03-09 DIAGNOSIS — Z79899 Other long term (current) drug therapy: Secondary | ICD-10-CM | POA: Diagnosis not present

## 2017-03-09 DIAGNOSIS — I10 Essential (primary) hypertension: Secondary | ICD-10-CM | POA: Diagnosis not present

## 2017-03-10 LAB — BASIC METABOLIC PANEL
BUN / CREAT RATIO: 12 (ref 9–20)
BUN: 12 mg/dL (ref 6–20)
CHLORIDE: 104 mmol/L (ref 96–106)
CO2: 24 mmol/L (ref 20–29)
Calcium: 9.4 mg/dL (ref 8.7–10.2)
Creatinine, Ser: 1.04 mg/dL (ref 0.76–1.27)
GFR calc non Af Amer: 91 mL/min/{1.73_m2} (ref >59)
GFR, EST AFRICAN AMERICAN: 106 mL/min/{1.73_m2} (ref >59)
GLUCOSE: 90 mg/dL (ref 65–99)
POTASSIUM: 4.6 mmol/L (ref 3.5–5.2)
SODIUM: 142 mmol/L (ref 134–144)

## 2017-03-10 LAB — HEPATIC FUNCTION PANEL
ALT: 89 IU/L — AB (ref 0–44)
AST: 56 IU/L — ABNORMAL HIGH (ref 0–40)
Albumin: 4.6 g/dL (ref 3.5–5.5)
Alkaline Phosphatase: 82 IU/L (ref 39–117)
BILIRUBIN, DIRECT: 0.17 mg/dL (ref 0.00–0.40)
Bilirubin Total: 1 mg/dL (ref 0.0–1.2)
Total Protein: 7.1 g/dL (ref 6.0–8.5)

## 2017-03-10 LAB — LIPID PANEL
CHOL/HDL RATIO: 5.6 ratio — AB (ref 0.0–5.0)
CHOLESTEROL TOTAL: 206 mg/dL — AB (ref 100–199)
HDL: 37 mg/dL — AB (ref >39)
LDL CALC: 146 mg/dL — AB (ref 0–99)
TRIGLYCERIDES: 115 mg/dL (ref 0–149)
VLDL Cholesterol Cal: 23 mg/dL (ref 5–40)

## 2017-03-11 ENCOUNTER — Encounter: Payer: Self-pay | Admitting: Family Medicine

## 2017-03-14 DIAGNOSIS — L570 Actinic keratosis: Secondary | ICD-10-CM | POA: Diagnosis not present

## 2017-03-28 ENCOUNTER — Ambulatory Visit: Payer: 59 | Admitting: Family Medicine

## 2017-03-28 ENCOUNTER — Encounter: Payer: Self-pay | Admitting: Family Medicine

## 2017-03-28 VITALS — BP 128/88 | Ht 72.0 in | Wt 261.0 lb

## 2017-03-28 DIAGNOSIS — E669 Obesity, unspecified: Secondary | ICD-10-CM | POA: Diagnosis not present

## 2017-03-28 DIAGNOSIS — R740 Nonspecific elevation of levels of transaminase and lactic acid dehydrogenase [LDH]: Secondary | ICD-10-CM

## 2017-03-28 DIAGNOSIS — E7849 Other hyperlipidemia: Secondary | ICD-10-CM

## 2017-03-28 DIAGNOSIS — R7401 Elevation of levels of liver transaminase levels: Secondary | ICD-10-CM

## 2017-03-28 DIAGNOSIS — I10 Essential (primary) hypertension: Secondary | ICD-10-CM | POA: Diagnosis not present

## 2017-03-28 MED ORDER — LISINOPRIL 5 MG PO TABS: 5.0000 mg | ORAL_TABLET | Freq: Every day | ORAL | 1 refills | Status: DC

## 2017-03-28 NOTE — Progress Notes (Signed)
Subjective:    Patient ID: Isaiah Hicks, male    DOB: July 07, 1980, 37 y.o.   MRN: [id number]  HPI  Patient is here today to follow up on Htn he is currently taking Lisinopril 5 mg one daily. States he does not eat healthy and gets some exercise.Did not take Bp med yesterday nor today.   Patient for blood pressure check up. Patient relates compliance with meds. Todays BP reviewed with the patient. Patient denies issues with medication. Patient relates reasonable diet. Patient tries to minimize salt. Patient aware of BP goals.  Patient here for follow-up regarding cholesterol.  Patient does try to maintain a reasonable diet.  Patient does take the medication on a regular basis.  Denies missing a dose.  The patient denies any obvious side effects.  Prior blood work results reviewed with the patient.  The patient is aware of his cholesterol goals and the need to keep it under good control to lessen the risk of disease.  Moderate obesity patient understands importance of losing weight we discussed in detail how to go about doing this Review of Systems  Constitutional: Negative for activity change, appetite change and fatigue.  HENT: Negative for congestion and rhinorrhea.   Respiratory: Negative for cough, chest tightness and shortness of breath.   Cardiovascular: Negative for chest pain and leg swelling.  Gastrointestinal: Negative for abdominal pain, diarrhea and nausea.  Endocrine: Negative for polydipsia and polyphagia.  Genitourinary: Negative for dysuria and hematuria.  Neurological: Negative for weakness and headaches.  Psychiatric/Behavioral: Negative for confusion and dysphoric mood.       Objective:   Physical Exam  Constitutional: He appears well-nourished. No distress.  HENT:  Head: Normocephalic and atraumatic.  Eyes: Right eye exhibits no discharge. Left eye exhibits no discharge.  Neck: No tracheal deviation present.  Cardiovascular: Normal rate, regular rhythm and  normal heart sounds.  No murmur heard. Pulmonary/Chest: Effort normal and breath sounds normal. No respiratory distress. He has no wheezes.  Musculoskeletal: He exhibits no edema.  Lymphadenopathy:    He has no cervical adenopathy.  Neurological: He is alert.  Skin: Skin is warm. No rash noted.  Psychiatric: His behavior is normal.  Vitals reviewed.         Assessment & Plan:  1. Essential hypertension Blood pressure under fair control continue current medication follow-up in 6 months follow-up sooner problems very important for the patient to lose weight - Hepatic function panel - Hepatitis B Surface AntiGEN - Hepatitis C Antibody - Ceruloplasmin - Ferritin - Iron Binding Cap (TIBC)([facility]) - Anti-Smooth Muscle Antibody, IGG  2. Other hyperlipidemia Check lab work.  Very important for the patient to bring this down may end up needing to be on a statin he will recheck lab work again in 2 months - Hepatic function panel - Hepatitis B Surface AntiGEN - Hepatitis C Antibody - Ceruloplasmin - Ferritin - Iron Binding Cap (TIBC)([facility]) - Anti-Smooth Muscle Antibody, IGG - US Abdomen Limited RUQ; Future  3. Elevated transaminase level Elevated liver enzyme probable fatty liver very important to lose weight very important to follow this additional lab work ordered very important to do this lab work in 2 months ultrasound ordered await results - Hepatic function panel - Hepatitis B Surface AntiGEN - Hepatitis C Antibody - Ceruloplasmin - Ferritin - Iron Binding Cap (TIBC)([facility]) - Anti-Smooth Muscle Antibody, IGG - US Abdomen Limited RUQ; Future  4. Obesity (BMI 35.0-39.9 without comorbidity) Significant obesity issues important to lose weight The patient's  BMI is calculated.  It is in the vital signs and acknowledged.  It is above the recommended BMI for the patient's height and weight.  The patient has been counseled regarding healthy diet,  restricted portions, avoiding excessive carbohydrates/sugary foods, and increase physical activity as health permits.  It is in the patient's best interest to lower the risk of secondary illness including heart disease strokes and cancer by losing weight.  The patient acknowledges this information.

## 2017-03-28 NOTE — Patient Instructions (Signed)
DASH Eating Plan DASH stands for "Dietary Approaches to Stop Hypertension." The DASH eating plan is a healthy eating plan that has been shown to reduce high blood pressure (hypertension). It may also reduce your risk for type 2 diabetes, heart disease, and stroke. The DASH eating plan may also help with weight loss. What are tips for following this plan? General guidelines  Avoid eating more than 2,300 mg (milligrams) of salt (sodium) a day. If you have hypertension, you may need to reduce your sodium intake to 1,500 mg a day.  Limit alcohol intake to no more than 1 drink a day for nonpregnant women and 2 drinks a day for men. One drink equals 12 oz of beer, 5 oz of wine, or 1 oz of hard liquor.  Work with your health care provider to maintain a healthy body weight or to lose weight. Ask what an ideal weight is for you.  Get at least 30 minutes of exercise that causes your heart to beat faster (aerobic exercise) most days of the week. Activities may include walking, swimming, or biking.  Work with your health care provider or diet and nutrition specialist (dietitian) to adjust your eating plan to your individual calorie needs. Reading food labels  Check food labels for the amount of sodium per serving. Choose foods with less than 5 percent of the Daily Value of sodium. Generally, foods with less than 300 mg of sodium per serving fit into this eating plan.  To find whole grains, look for the word "whole" as the first word in the ingredient list. Shopping  Buy products labeled as "low-sodium" or "no salt added."  Buy fresh foods. Avoid canned foods and premade or frozen meals. Cooking  Avoid adding salt when cooking. Use salt-free seasonings or herbs instead of table salt or sea salt. Check with your health care provider or pharmacist before using salt substitutes.  Do not fry foods. Cook foods using healthy methods such as baking, boiling, grilling, and broiling instead.  Cook with  heart-healthy oils, such as olive, canola, soybean, or sunflower oil. Meal planning   Eat a balanced diet that includes: ? 5 or more servings of fruits and vegetables each day. At each meal, try to fill half of your plate with fruits and vegetables. ? Up to 6-8 servings of whole grains each day. ? Less than 6 oz of lean meat, poultry, or fish each day. A 3-oz serving of meat is about the same size as a deck of cards. One egg equals 1 oz. ? 2 servings of low-fat dairy each day. ? A serving of nuts, seeds, or beans 5 times each week. ? Heart-healthy fats. Healthy fats called Omega-3 fatty acids are found in foods such as flaxseeds and coldwater fish, like sardines, salmon, and mackerel.  Limit how much you eat of the following: ? Canned or prepackaged foods. ? Food that is high in trans fat, such as fried foods. ? Food that is high in saturated fat, such as fatty meat. ? Sweets, desserts, sugary drinks, and other foods with added sugar. ? Full-fat dairy products.  Do not salt foods before eating.  Try to eat at least 2 vegetarian meals each week.  Eat more home-cooked food and less restaurant, buffet, and fast food.  When eating at a restaurant, ask that your food be prepared with less salt or no salt, if possible. What foods are recommended? The items listed may not be a complete list. Talk with your dietitian about what   dietary choices are best for you. Grains Whole-grain or whole-wheat bread. Whole-grain or whole-wheat pasta. Brown rice. Oatmeal. Quinoa. Bulgur. Whole-grain and low-sodium cereals. Pita bread. Low-fat, low-sodium crackers. Whole-wheat flour tortillas. Vegetables Fresh or frozen vegetables (raw, steamed, roasted, or grilled). Low-sodium or reduced-sodium tomato and vegetable juice. Low-sodium or reduced-sodium tomato sauce and tomato paste. Low-sodium or reduced-sodium canned vegetables. Fruits All fresh, dried, or frozen fruit. Canned fruit in natural juice (without  added sugar). Meat and other protein foods Skinless chicken or turkey. Ground chicken or turkey. Pork with fat trimmed off. Fish and seafood. Egg whites. Dried beans, peas, or lentils. Unsalted nuts, nut butters, and seeds. Unsalted canned beans. Lean cuts of beef with fat trimmed off. Low-sodium, lean deli meat. Dairy Low-fat (1%) or fat-free (skim) milk. Fat-free, low-fat, or reduced-fat cheeses. Nonfat, low-sodium ricotta or cottage cheese. Low-fat or nonfat yogurt. Low-fat, low-sodium cheese. Fats and oils Soft margarine without trans fats. Vegetable oil. Low-fat, reduced-fat, or light mayonnaise and salad dressings (reduced-sodium). Canola, safflower, olive, soybean, and sunflower oils. Avocado. Seasoning and other foods Herbs. Spices. Seasoning mixes without salt. Unsalted popcorn and pretzels. Fat-free sweets. What foods are not recommended? The items listed may not be a complete list. Talk with your dietitian about what dietary choices are best for you. Grains Baked goods made with fat, such as croissants, muffins, or some breads. Dry pasta or rice meal packs. Vegetables Creamed or fried vegetables. Vegetables in a cheese sauce. Regular canned vegetables (not low-sodium or reduced-sodium). Regular canned tomato sauce and paste (not low-sodium or reduced-sodium). Regular tomato and vegetable juice (not low-sodium or reduced-sodium). Pickles. Olives. Fruits Canned fruit in a light or heavy syrup. Fried fruit. Fruit in cream or butter sauce. Meat and other protein foods Fatty cuts of meat. Ribs. Fried meat. Bacon. Sausage. Bologna and other processed lunch meats. Salami. Fatback. Hotdogs. Bratwurst. Salted nuts and seeds. Canned beans with added salt. Canned or smoked fish. Whole eggs or egg yolks. Chicken or turkey with skin. Dairy Whole or 2% milk, cream, and half-and-half. Whole or full-fat cream cheese. Whole-fat or sweetened yogurt. Full-fat cheese. Nondairy creamers. Whipped toppings.  Processed cheese and cheese spreads. Fats and oils Butter. Stick margarine. Lard. Shortening. Ghee. Bacon fat. Tropical oils, such as coconut, palm kernel, or palm oil. Seasoning and other foods Salted popcorn and pretzels. Onion salt, garlic salt, seasoned salt, table salt, and sea salt. Worcestershire sauce. Tartar sauce. Barbecue sauce. Teriyaki sauce. Soy sauce, including reduced-sodium. Steak sauce. Canned and packaged gravies. Fish sauce. Oyster sauce. Cocktail sauce. Horseradish that you find on the shelf. Ketchup. Mustard. Meat flavorings and tenderizers. Bouillon cubes. Hot sauce and Tabasco sauce. Premade or packaged marinades. Premade or packaged taco seasonings. Relishes. Regular salad dressings. Where to find more information:  National Heart, Lung, and Blood Institute: www.nhlbi.nih.gov  American Heart Association: www.heart.org Summary  The DASH eating plan is a healthy eating plan that has been shown to reduce high blood pressure (hypertension). It may also reduce your risk for type 2 diabetes, heart disease, and stroke.  With the DASH eating plan, you should limit salt (sodium) intake to 2,300 mg a day. If you have hypertension, you may need to reduce your sodium intake to 1,500 mg a day.  When on the DASH eating plan, aim to eat more fresh fruits and vegetables, whole grains, lean proteins, low-fat dairy, and heart-healthy fats.  Work with your health care provider or diet and nutrition specialist (dietitian) to adjust your eating plan to your individual   calorie needs. This information is not intended to replace advice given to you by your health care provider. Make sure you discuss any questions you have with your health care provider. Document Released: 12/15/2010 Document Revised: 12/20/2015 Document Reviewed: 12/20/2015 Elsevier Interactive Patient Education  2018 Elsevier Inc.  

## 2017-03-29 ENCOUNTER — Ambulatory Visit: Payer: 59 | Admitting: Family Medicine

## 2017-04-25 DIAGNOSIS — L570 Actinic keratosis: Secondary | ICD-10-CM | POA: Diagnosis not present

## 2017-04-25 DIAGNOSIS — L218 Other seborrheic dermatitis: Secondary | ICD-10-CM | POA: Diagnosis not present

## 2017-05-14 ENCOUNTER — Telehealth: Payer: Self-pay | Admitting: Family Medicine

## 2017-05-14 NOTE — Telephone Encounter (Signed)
[facility] - ? When to schedule abdominal ultrasound  (pt was supposed to call us end of April)

## 2017-05-14 NOTE — Telephone Encounter (Signed)
Spoke with pt, 5/15 is a good day for U/S

## 2017-05-14 NOTE — Telephone Encounter (Signed)
U/S scheduled & LMOVM to notify pt

## 2017-05-20 ENCOUNTER — Other Ambulatory Visit: Payer: Self-pay

## 2017-05-20 ENCOUNTER — Encounter (HOSPITAL_COMMUNITY): Payer: Self-pay

## 2017-05-20 ENCOUNTER — Emergency Department (HOSPITAL_COMMUNITY)
Admission: EM | Admit: 2017-05-20 | Discharge: 2017-05-20 | Disposition: A | Discharge status: Home / Self Care | Payer: 59 | Attending: Emergency Medicine | Admitting: Emergency Medicine

## 2017-05-20 DIAGNOSIS — J020 Streptococcal pharyngitis: Secondary | ICD-10-CM | POA: Insufficient documentation

## 2017-05-20 DIAGNOSIS — I10 Essential (primary) hypertension: Secondary | ICD-10-CM | POA: Insufficient documentation

## 2017-05-20 DIAGNOSIS — R05 Cough: Secondary | ICD-10-CM | POA: Diagnosis not present

## 2017-05-20 DIAGNOSIS — Z79899 Other long term (current) drug therapy: Secondary | ICD-10-CM | POA: Diagnosis not present

## 2017-05-20 DIAGNOSIS — R509 Fever, unspecified: Secondary | ICD-10-CM | POA: Diagnosis not present

## 2017-05-20 DIAGNOSIS — J029 Acute pharyngitis, unspecified: Secondary | ICD-10-CM | POA: Diagnosis present

## 2017-05-20 HISTORY — DX: Essential (primary) hypertension: I10

## 2017-05-20 LAB — GROUP A STREP BY PCR: Group A Strep by PCR: DETECTED — AB

## 2017-05-20 MED ORDER — PENICILLIN G BENZATHINE 1200000 UNIT/2ML IM SUSP
1.2000 10*6.[IU] | Freq: Once | INTRAMUSCULAR | Status: AC
  Administered 2017-05-20: 1.2 10*6.[IU] via INTRAMUSCULAR
  Filled 2017-05-20: qty 2

## 2017-05-20 MED ORDER — IBUPROFEN 800 MG PO TABS: 800.0000 mg | ORAL_TABLET | Freq: Three times a day (TID) | ORAL | 0 refills | Status: DC

## 2017-05-20 MED ORDER — DEXAMETHASONE SODIUM PHOSPHATE 10 MG/ML IJ SOLN
10.0000 mg | Freq: Once | INTRAMUSCULAR | Status: AC
  Administered 2017-05-20: 10 mg via INTRAMUSCULAR
  Filled 2017-05-20: qty 1
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
## 2017-05-20 NOTE — Discharge Instructions (Signed)
Ibuprofen for pain / fever Strep test positive

## 2017-05-20 NOTE — ED Triage Notes (Signed)
Pt reports sore throat and chills since Friday.

## 2017-05-21 ENCOUNTER — Telehealth (HOSPITAL_BASED_OUTPATIENT_CLINIC_OR_DEPARTMENT_OTHER): Payer: Self-pay | Admitting: Emergency Medicine

## 2017-05-21 NOTE — Telephone Encounter (Signed)
Post ED Visit - Positive Culture Follow-up  Culture report reviewed by antimicrobial stewardship pharmacist:   Enzo Bi, Pharm.D.  Celedonio Miyamoto, Pharm.D., BCPS AQ-ID  Garvin Fila, Pharm.D., BCPS  Georgina Pillion, Pharm.D., BCPS  [place], [address].D., BCPS, AAHIVP  Estella Husk, Pharm.D., BCPS, AAHIVP  Lysle Pearl, PharmD, BCPS  Sherlynn Carbon, PharmD  Pollyann Samples, PharmD, BCPS Sharin Mons PharmD  Positive strep culture Treated with bicillin, organism sensitive to the same and no further patient follow-up is required at this time.  Berle Mull 05/21/2017, 1:00 PM

## 2017-05-23 ENCOUNTER — Ambulatory Visit (HOSPITAL_COMMUNITY)
Admission: RE | Admit: 2017-05-23 | Discharge: 2017-05-23 | Disposition: A | Discharge status: Home / Self Care | Payer: 59 | Source: Ambulatory Visit | Attending: Family Medicine | Admitting: Family Medicine

## 2017-05-23 DIAGNOSIS — E7849 Other hyperlipidemia: Secondary | ICD-10-CM | POA: Diagnosis not present

## 2017-05-23 DIAGNOSIS — R7401 Elevation of levels of liver transaminase levels: Secondary | ICD-10-CM

## 2017-05-23 DIAGNOSIS — R740 Nonspecific elevation of levels of transaminase and lactic acid dehydrogenase [LDH]: Secondary | ICD-10-CM | POA: Insufficient documentation

## 2017-05-23 DIAGNOSIS — K7689 Other specified diseases of liver: Secondary | ICD-10-CM | POA: Diagnosis not present

## 2017-06-01 ENCOUNTER — Ambulatory Visit: Payer: 59 | Admitting: Family Medicine

## 2017-06-01 ENCOUNTER — Encounter: Payer: Self-pay | Admitting: Family Medicine

## 2017-06-01 VITALS — BP 118/70 | Temp 98.7°F | Ht 72.0 in | Wt 251.0 lb

## 2017-06-01 DIAGNOSIS — J019 Acute sinusitis, unspecified: Secondary | ICD-10-CM

## 2017-06-01 DIAGNOSIS — B9689 Other specified bacterial agents as the cause of diseases classified elsewhere: Secondary | ICD-10-CM | POA: Diagnosis not present

## 2017-06-01 MED ORDER — AMOXICILLIN-POT CLAVULANATE 875-125 MG PO TABS: 1.0000 | ORAL_TABLET | Freq: Two times a day (BID) | ORAL | 0 refills | Status: DC

## 2017-06-01 NOTE — Progress Notes (Signed)
   Subjective:    Patient ID: Isaiah Hicks, male    DOB: 10/25/1980, 37 y.o.   MRN: [id number]  Sinusitis  This is a new problem. The current episode started yesterday. Associated symptoms include congestion, coughing, ear pain, headaches and a sore throat. Pertinent negatives include no chills. (Nausea) Treatments tried: zyrtec, motrin..   Significant head congestion drainage sinus pressure denies wheezing difficulty breathing   Review of Systems  Constitutional: Negative for activity change, chills and fever.  HENT: Positive for congestion, ear pain, rhinorrhea and sore throat.   Eyes: Negative for discharge.  Respiratory: Positive for cough. Negative for wheezing.   Cardiovascular: Negative for chest pain.  Gastrointestinal: Negative for nausea and vomiting.  Musculoskeletal: Negative for arthralgias.  Neurological: Positive for headaches.       Objective:   Physical Exam  Constitutional: He appears well-developed.  HENT:  Head: Normocephalic.  Mouth/Throat: Oropharynx is clear and moist. No oropharyngeal exudate.  Neck: Normal range of motion.  Cardiovascular: Normal rate, regular rhythm and normal heart sounds.  No murmur heard. Pulmonary/Chest: Effort normal and breath sounds normal. He has no wheezes.  Lymphadenopathy:    He has no cervical adenopathy.  Neurological: He exhibits normal muscle tone.  Skin: Skin is warm and dry.  Nursing note and vitals reviewed.         Assessment & Plan:  No evidence of pneumonia no evidence of strep throat more than likely this is a sinus infection or viral syndrome I find no evidence of any type of underlying tickborne disease.  Follow-up if progressive troubles or

## 2017-07-01 DIAGNOSIS — L03319 Cellulitis of trunk, unspecified: Secondary | ICD-10-CM | POA: Diagnosis not present

## 2017-07-02 DIAGNOSIS — R740 Nonspecific elevation of levels of transaminase and lactic acid dehydrogenase [LDH]: Secondary | ICD-10-CM | POA: Diagnosis not present

## 2017-07-02 DIAGNOSIS — I10 Essential (primary) hypertension: Secondary | ICD-10-CM | POA: Diagnosis not present

## 2017-07-02 DIAGNOSIS — E7849 Other hyperlipidemia: Secondary | ICD-10-CM | POA: Diagnosis not present

## 2017-07-03 ENCOUNTER — Encounter: Payer: Self-pay | Admitting: Family Medicine

## 2017-07-03 ENCOUNTER — Ambulatory Visit: Payer: 59 | Admitting: Family Medicine

## 2017-07-03 VITALS — BP 142/88 | Temp 98.1°F | Ht 72.0 in | Wt 251.0 lb

## 2017-07-03 DIAGNOSIS — Z23 Encounter for immunization: Secondary | ICD-10-CM | POA: Diagnosis not present

## 2017-07-03 DIAGNOSIS — T148XXA Other injury of unspecified body region, initial encounter: Secondary | ICD-10-CM | POA: Diagnosis not present

## 2017-07-03 DIAGNOSIS — L0291 Cutaneous abscess, unspecified: Secondary | ICD-10-CM

## 2017-07-03 DIAGNOSIS — L02224 Furuncle of groin: Secondary | ICD-10-CM | POA: Diagnosis not present

## 2017-07-03 DIAGNOSIS — K760 Fatty (change of) liver, not elsewhere classified: Secondary | ICD-10-CM | POA: Insufficient documentation

## 2017-07-03 DIAGNOSIS — L02214 Cutaneous abscess of groin: Secondary | ICD-10-CM | POA: Diagnosis not present

## 2017-07-03 LAB — HEPATITIS C ANTIBODY: Hep C Virus Ab: <0.1 s/co ratio (ref 0.0–0.9)

## 2017-07-03 LAB — IRON AND TIBC
IRON SATURATION: 29 % (ref 15–55)
IRON: 87 ug/dL (ref 38–169)
Total Iron Binding Capacity: 296 ug/dL (ref 250–450)
UIBC: 209 ug/dL (ref 111–343)

## 2017-07-03 LAB — CERULOPLASMIN: Ceruloplasmin: 26.4 mg/dL (ref 16.0–31.0)

## 2017-07-03 LAB — HEPATITIS B SURFACE ANTIGEN: Hepatitis B Surface Ag: NEGATIVE

## 2017-07-03 LAB — HEPATIC FUNCTION PANEL
ALT: 48 IU/L — AB (ref 0–44)
AST: 29 IU/L (ref 0–40)
Albumin: 4.6 g/dL (ref 3.5–5.5)
Alkaline Phosphatase: 82 IU/L (ref 39–117)
Bilirubin Total: 1.2 mg/dL (ref 0.0–1.2)
Bilirubin, Direct: 0.26 mg/dL (ref 0.00–0.40)
Total Protein: 7.2 g/dL (ref 6.0–8.5)

## 2017-07-03 LAB — ANTI-SMOOTH MUSCLE ANTIBODY, IGG: Smooth Muscle Ab: 9 Units (ref 0–19)

## 2017-07-03 LAB — FERRITIN: FERRITIN: 260 ng/mL (ref 30–400)

## 2017-07-03 MED ORDER — CLINDAMYCIN HCL 300 MG PO CAPS: 300.0000 mg | ORAL_CAPSULE | Freq: Three times a day (TID) | ORAL | 0 refills | Status: DC

## 2017-07-03 MED ORDER — CEFTRIAXONE SODIUM 1 G IJ SOLR
500.0000 mg | Freq: Once | INTRAMUSCULAR | Status: AC
  Administered 2017-07-03: 500 mg via INTRAMUSCULAR

## 2017-07-03 MED ORDER — MUPIROCIN 2 % EX OINT: TOPICAL_OINTMENT | CUTANEOUS | 0 refills | Status: DC

## 2017-07-03 NOTE — Progress Notes (Signed)
   Subjective:    Patient ID: Isaiah Hicks, male    DOB: 1980-08-04, 37 y.o.   MRN: [id number]  HPI  Patient is here today with complaints of a boil on right hip (third boil in a month).Was seen at Urgent care on Sunday and told him it was not ready to be lanced. They gave him Doxycycline and it has made him nauseous.The last month or so he has not been feeling well. Patient states started off small bump in was put on doxycycline by urgent care center now relates mild nausea does not feel good feels like is going to pass out relates low-grade sweats but no high fevers relates increased pain discomfort in the groin region Started with a small bump in the right lower hip area then progressed into having a red area that swelled up now it appears to be an abscess complains of pain discomfort Review of Systems Please see above no fevers or chills but feeling bad though    Objective:   Physical Exam  Abdomen soft no masses no tenderness lower abdomen region in the right groin has  Procedure-area was prepped numbed with 1% lidocaine No. 11 blade was used moderate amount of pus obtained packing was placed no complications, Betadine was used, sterile procedure use, culture sent    Assessment & Plan:  Rocephin 500 IM Stop doxycycline because of nausea Clindamycin 300 3 times daily 10 days Warm compresses frequently Recheck 24 hours Remove packing tomorrow If progressive troubles or worse follow-up sooner Return to work next Monday Bactroban in the nose every single evening

## 2017-07-04 ENCOUNTER — Ambulatory Visit: Payer: 59 | Admitting: Family Medicine

## 2017-07-04 ENCOUNTER — Encounter: Payer: Self-pay | Admitting: Family Medicine

## 2017-07-04 VITALS — BP 130/80 | Temp 98.4°F | Ht 72.0 in | Wt 250.0 lb

## 2017-07-04 DIAGNOSIS — L0291 Cutaneous abscess, unspecified: Secondary | ICD-10-CM

## 2017-07-04 NOTE — Progress Notes (Signed)
   Subjective:    Patient ID: Isaiah Hicks, male    DOB: Apr 01, 1980, 37 y.o.   MRN: [id number]  HPI  Patient is here today to follow up on abscess. He states it is oosing pus and it is red. Recheck of abscessIt is doing well the packing is in place there is no fever chills vomiting Review of Systems  please see above    Objective:   Physical Exam Lungs are clear heart regular  abscess looks much better packing removed       Assessment & Plan:  Warm compresses proper way to take care of it plus also what warning signs to watch for continue antibiotics recheck on Monday

## 2017-07-06 LAB — WOUND CULTURE: Organism ID, Bacteria: NONE SEEN

## 2017-07-09 ENCOUNTER — Ambulatory Visit: Payer: 59 | Admitting: Family Medicine

## 2017-07-09 ENCOUNTER — Encounter: Payer: Self-pay | Admitting: Family Medicine

## 2017-07-09 VITALS — BP 124/72 | Temp 97.9°F | Ht 72.0 in | Wt 253.0 lb

## 2017-07-09 DIAGNOSIS — L0291 Cutaneous abscess, unspecified: Secondary | ICD-10-CM

## 2017-07-09 DIAGNOSIS — E7849 Other hyperlipidemia: Secondary | ICD-10-CM

## 2017-07-09 DIAGNOSIS — I10 Essential (primary) hypertension: Secondary | ICD-10-CM

## 2017-07-09 MED ORDER — CLINDAMYCIN HCL 300 MG PO CAPS: 300.0000 mg | ORAL_CAPSULE | Freq: Three times a day (TID) | ORAL | 0 refills | Status: DC

## 2017-07-09 NOTE — Progress Notes (Signed)
   Subjective:    Patient ID: Isaiah Hicks, male    DOB: 1980/09/20, 37 y.o.   MRN: [id number]  HPIFollow up abscess. Pt states area is better. No drainage. Taking clindamycin.  See previous note is been treated with incision and drainage He presents for follow-up states his ear is doing better and still notes minimally tender slightly enlarged no drainage from it currently denies any other troubles   Review of Systems  Constitutional: Negative for activity change, appetite change and fatigue.  HENT: Negative for congestion and rhinorrhea.   Respiratory: Negative for cough, chest tightness and shortness of breath.   Cardiovascular: Negative for chest pain and leg swelling.  Gastrointestinal: Negative for abdominal pain, diarrhea and nausea.  Endocrine: Negative for polydipsia and polyphagia.  Genitourinary: Negative for dysuria and hematuria.  Neurological: Negative for weakness and headaches.  Psychiatric/Behavioral: Negative for confusion and dysphoric mood.       Objective:   Physical Exam  Constitutional: He appears well-nourished. No distress.  Cardiovascular: Normal rate, regular rhythm and normal heart sounds.  No murmur heard. Pulmonary/Chest: Effort normal and breath sounds normal. No respiratory distress.  Musculoskeletal: He exhibits no edema.  Lymphadenopathy:    He has no cervical adenopathy.  Neurological: He is alert.  Psychiatric: His behavior is normal.  Vitals reviewed.         Assessment & Plan:  This area appears to be healing significantly He will finish out the antibiotics Additional prescription was given to get filled in case this should flare back up on him  Fatty liver liver enzymes better rest the lab work looks good  Patient will do lab work before follow-up in September he has occasional headaches after eating but no vomiting or blurred vision

## 2017-07-22 ENCOUNTER — Other Ambulatory Visit: Payer: Self-pay

## 2017-07-22 ENCOUNTER — Emergency Department (HOSPITAL_COMMUNITY)
Admission: EM | Admit: 2017-07-22 | Discharge: 2017-07-22 | Disposition: A | Discharge status: Home / Self Care | Payer: No Typology Code available for payment source | Attending: Emergency Medicine | Admitting: Emergency Medicine

## 2017-07-22 ENCOUNTER — Encounter (HOSPITAL_COMMUNITY): Payer: Self-pay | Admitting: Emergency Medicine

## 2017-07-22 DIAGNOSIS — I10 Essential (primary) hypertension: Secondary | ICD-10-CM | POA: Insufficient documentation

## 2017-07-22 DIAGNOSIS — Z7721 Contact with and (suspected) exposure to potentially hazardous body fluids: Secondary | ICD-10-CM

## 2017-07-22 DIAGNOSIS — Z79899 Other long term (current) drug therapy: Secondary | ICD-10-CM | POA: Insufficient documentation

## 2017-07-22 LAB — RAPID HIV SCREEN (HIV 1/2 AB+AG)
HIV 1/2 ANTIBODIES: NONREACTIVE
HIV-1 P24 ANTIGEN - HIV24: NONREACTIVE

## 2017-07-22 NOTE — Discharge Instructions (Signed)
Please follow-up with your job regarding further testing.
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
## 2017-07-24 LAB — HEPATITIS PANEL, ACUTE
HCV Ab: <0.1 s/co ratio (ref 0.0–0.9)
HEP B S AG: NEGATIVE
Hep A IgM: NEGATIVE
Hep B C IgM: NEGATIVE

## 2017-08-15 DIAGNOSIS — L820 Inflamed seborrheic keratosis: Secondary | ICD-10-CM | POA: Diagnosis not present

## 2017-08-15 DIAGNOSIS — L570 Actinic keratosis: Secondary | ICD-10-CM | POA: Diagnosis not present

## 2017-09-27 ENCOUNTER — Ambulatory Visit: Payer: 59 | Admitting: Family Medicine

## 2017-10-25 ENCOUNTER — Encounter: Payer: Self-pay | Admitting: Family Medicine

## 2017-10-25 ENCOUNTER — Ambulatory Visit: Payer: 59 | Admitting: Family Medicine

## 2017-10-25 VITALS — BP 136/92 | Ht 72.0 in | Wt 257.0 lb

## 2017-10-25 DIAGNOSIS — Z111 Encounter for screening for respiratory tuberculosis: Secondary | ICD-10-CM

## 2017-10-25 DIAGNOSIS — R05 Cough: Secondary | ICD-10-CM

## 2017-10-25 DIAGNOSIS — L578 Other skin changes due to chronic exposure to nonionizing radiation: Secondary | ICD-10-CM | POA: Diagnosis not present

## 2017-10-25 DIAGNOSIS — R059 Cough, unspecified: Secondary | ICD-10-CM

## 2017-10-25 DIAGNOSIS — L570 Actinic keratosis: Secondary | ICD-10-CM | POA: Diagnosis not present

## 2017-10-25 DIAGNOSIS — L218 Other seborrheic dermatitis: Secondary | ICD-10-CM | POA: Diagnosis not present

## 2017-10-25 DIAGNOSIS — I10 Essential (primary) hypertension: Secondary | ICD-10-CM

## 2017-10-25 MED ORDER — AMLODIPINE BESYLATE 5 MG PO TABS: 5.0000 mg | ORAL_TABLET | Freq: Every day | ORAL | 5 refills | Status: DC

## 2017-10-25 NOTE — Progress Notes (Signed)
   Subjective:    Patient ID: Isaiah Hicks, male    DOB: 1980-04-20, 37 y.o.   MRN: [id number]  HPI Patient is here today to follow up on chronic health issues.He states he eats healthy and he gets some exercise. He does not see any specialists.  This patient is trying to be healthy but states he does not always get a chance to exercise and does not always get healthy Patient for blood pressure check up.  The patient does have hypertension.  The patient is on medication.  Patient relates compliance with meds. Todays BP reviewed with the patient. Patient denies issues with medication. Patient relates reasonable diet. Patient tries to minimize salt. Patient aware of BP goals.   Review of Systems  Constitutional: Negative for activity change, fatigue and fever.  HENT: Negative for congestion and rhinorrhea.   Respiratory: Negative for cough and shortness of breath.   Cardiovascular: Negative for chest pain and leg swelling.  Gastrointestinal: Negative for abdominal pain, diarrhea and nausea.  Genitourinary: Negative for dysuria and hematuria.  Neurological: Negative for weakness and headaches.  Psychiatric/Behavioral: Negative for agitation and behavioral problems.       Objective:   Physical Exam  Constitutional: He appears well-nourished. No distress.  HENT:  Head: Normocephalic and atraumatic.  Eyes: Right eye exhibits no discharge. Left eye exhibits no discharge.  Neck: No tracheal deviation present.  Cardiovascular: Normal rate, regular rhythm and normal heart sounds.  No murmur heard. Pulmonary/Chest: Effort normal and breath sounds normal. No respiratory distress.  Musculoskeletal: He exhibits no edema.  Lymphadenopathy:    He has no cervical adenopathy.  Neurological: He is alert. Coordination normal.  Skin: Skin is warm and dry.  Psychiatric: He has a normal mood and affect. His behavior is normal.  Vitals reviewed.         Assessment & Plan:  The patient is  capable of being a foster parent we will do a TB blood test per protocol  Blood pressure good control continue current medications patient did not tolerate lisinopril switched over to amlodipine he will follow-up again in 6 months sooner problems  Patient will watch diet try to exercise try to bring weight down  Patient does have fatty liver we will check lab work again in the springtime when he follows up

## 2017-10-28 LAB — QUANTIFERON-TB GOLD PLUS
QUANTIFERON TB1 AG VALUE: 0.05 [IU]/mL
QUANTIFERON-TB GOLD PLUS: NEGATIVE
QuantiFERON Nil Value: 0.05 IU/mL
QuantiFERON TB2 Ag Value: 0.05 IU/mL

## 2017-12-31 DIAGNOSIS — M7731 Calcaneal spur, right foot: Secondary | ICD-10-CM | POA: Diagnosis not present

## 2017-12-31 DIAGNOSIS — M205X1 Other deformities of toe(s) (acquired), right foot: Secondary | ICD-10-CM | POA: Diagnosis not present

## 2017-12-31 DIAGNOSIS — M7732 Calcaneal spur, left foot: Secondary | ICD-10-CM | POA: Diagnosis not present

## 2018-02-18 ENCOUNTER — Other Ambulatory Visit: Payer: Self-pay | Admitting: Family Medicine

## 2018-02-18 ENCOUNTER — Telehealth: Payer: Self-pay | Admitting: Family Medicine

## 2018-02-18 MED ORDER — OSELTAMIVIR PHOSPHATE 75 MG PO CAPS: ORAL_CAPSULE | ORAL | 0 refills | Status: DC

## 2018-02-18 NOTE — Telephone Encounter (Signed)
Wife -Waynetta Sandy was seen on 2/5 and now patient started yesterday with fever,chills,bodyaches and was wondering if you can call in [place] to [facility]

## 2018-02-18 NOTE — Telephone Encounter (Signed)
Left message to return call. Medication sent to pharmacy

## 2018-02-18 NOTE — Telephone Encounter (Signed)
Tamiflu 75 mg 1 twice daily for 5 days If progressive symptoms shortness of breath or other problems it would be wise to be seen.

## 2018-02-18 NOTE — Telephone Encounter (Signed)
Please advise. Thank you

## 2018-02-19 NOTE — Telephone Encounter (Signed)
Contacted patient. Pt states that he has picked up the medication

## 2018-04-23 ENCOUNTER — Other Ambulatory Visit: Payer: Self-pay | Admitting: Family Medicine

## 2018-04-24 ENCOUNTER — Ambulatory Visit: Payer: 59 | Admitting: Family Medicine

## 2018-04-24 ENCOUNTER — Ambulatory Visit (INDEPENDENT_AMBULATORY_CARE_PROVIDER_SITE_OTHER): Payer: 59 | Admitting: Family Medicine

## 2018-04-24 ENCOUNTER — Other Ambulatory Visit: Payer: Self-pay

## 2018-04-24 DIAGNOSIS — R21 Rash and other nonspecific skin eruption: Secondary | ICD-10-CM | POA: Diagnosis not present

## 2018-04-24 DIAGNOSIS — E7849 Other hyperlipidemia: Secondary | ICD-10-CM

## 2018-04-24 DIAGNOSIS — Z91018 Allergy to other foods: Secondary | ICD-10-CM

## 2018-04-24 DIAGNOSIS — R0602 Shortness of breath: Secondary | ICD-10-CM

## 2018-04-24 DIAGNOSIS — I10 Essential (primary) hypertension: Secondary | ICD-10-CM | POA: Diagnosis not present

## 2018-04-24 MED ORDER — AMLODIPINE BESYLATE 5 MG PO TABS: ORAL_TABLET | ORAL | 1 refills | Status: DC
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
## 2018-10-28 ENCOUNTER — Ambulatory Visit (INDEPENDENT_AMBULATORY_CARE_PROVIDER_SITE_OTHER): Payer: 59 | Admitting: Family Medicine

## 2018-10-28 ENCOUNTER — Other Ambulatory Visit: Payer: Self-pay

## 2018-10-28 DIAGNOSIS — K760 Fatty (change of) liver, not elsewhere classified: Secondary | ICD-10-CM

## 2018-10-28 DIAGNOSIS — E7849 Other hyperlipidemia: Secondary | ICD-10-CM | POA: Diagnosis not present

## 2018-10-28 DIAGNOSIS — I10 Essential (primary) hypertension: Secondary | ICD-10-CM | POA: Diagnosis not present

## 2018-10-28 DIAGNOSIS — Z91018 Allergy to other foods: Secondary | ICD-10-CM

## 2018-10-28 MED ORDER — AMLODIPINE BESYLATE 5 MG PO TABS: ORAL_TABLET | ORAL | 1 refills | Status: DC

## 2018-10-28 NOTE — Progress Notes (Signed)
   Subjective:    Patient ID: Isaiah Hicks, male    DOB: Jan 24, 1980, 38 y.o.   MRN: [id number]  Hypertension This is a chronic problem. Pertinent negatives include no chest pain, headaches or shortness of breath. There are no compliance problems.   pt states he is able to check blood pressure regular. Pt is needing 90 day supply of medication due to insurance changes.  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 Virtual Visit via Video Note  I connected with Isaiah Hicks on 10/28/18 at  1:10 PM EDT by a video enabled telemedicine application and verified that I am speaking with the correct person using two identifiers.  Location: Patient: home Provider: office   I discussed the limitations of evaluation and management by telemedicine and the availability of in person appointments. The patient expressed understanding and agreed to proceed.  History of Present Illness:    Observations/Objective:   Assessment and Plan:   Follow Up Instructions:    I discussed the assessment and treatment plan with the patient. The patient was provided an opportunity to ask questions and all were answered. The patient agreed with the plan and demonstrated an understanding of the instructions.   The patient was advised to call back or seek an in-person evaluation if the symptoms worsen or if the condition fails to improve as anticipated.  I provided 17 minutes of non-face-to-face time during this encounter.   Vicente Males, LPN     Review of Systems  Constitutional: Negative for diaphoresis and fatigue.  HENT: Negative for congestion and rhinorrhea.   Respiratory: Negative for cough and shortness of breath.   Cardiovascular: Negative for chest pain and leg swelling.   Gastrointestinal: Negative for abdominal pain and diarrhea.  Skin: Negative for color change and rash.  Neurological: Negative for dizziness and headaches.  Psychiatric/Behavioral: Negative for behavioral problems and confusion.       Objective:   Physical Exam  Patient had virtual visit Appears to be in no distress Atraumatic Neuro able to relate and oriented No apparent resp distress Color normal   Patient does have a possible food allergy he states back in the spring he broke out with a hot flash on his face therefore we will check alpha gal    Assessment & Plan:  HTN Takes his medicine regular basis denies missing it states overall it does help him Has history of fatty liver we talked at length about the importance of healthy eating losing weight Lab work ordered await results Patient defers on flu shot

## 2019-01-20 ENCOUNTER — Ambulatory Visit (INDEPENDENT_AMBULATORY_CARE_PROVIDER_SITE_OTHER): Payer: 59 | Admitting: Family Medicine

## 2019-01-20 ENCOUNTER — Encounter: Payer: Self-pay | Admitting: Family Medicine

## 2019-01-20 DIAGNOSIS — L209 Atopic dermatitis, unspecified: Secondary | ICD-10-CM | POA: Diagnosis not present

## 2019-01-20 MED ORDER — PREDNISONE 20 MG PO TABS: ORAL_TABLET | ORAL | 0 refills | Status: DC

## 2019-01-20 MED ORDER — TRIAMCINOLONE ACETONIDE 0.1 % EX CREA: TOPICAL_CREAM | CUTANEOUS | 4 refills | Status: DC

## 2019-01-20 MED ORDER — HYDROXYZINE PAMOATE 25 MG PO CAPS: ORAL_CAPSULE | ORAL | 0 refills | Status: DC

## 2019-01-20 NOTE — Progress Notes (Signed)
   Subjective:    Patient ID: Isaiah Hicks, male    DOB: 12-Apr-1980, 39 y.o.   MRN: [id number]  Rash This is a new problem. Episode onset: pt got into poison oak about a month ago; rash 2 weeks ago. Location: back side of right knee, then to left knee, down legs and up sides, arms, chest and stomach. The rash is characterized by burning, itchiness, dryness and scaling. Associated with: poison oak. Treatments tried: soap that has dried poison oak up. The treatment provided mild relief.   Virtual Visit via Video Note  I connected with Drago Hammonds Depaulo on 01/20/19 at  1:10 PM EST by a video enabled telemedicine application and verified that I am speaking with the correct person using two identifiers.  Location: Patient: home Provider: office   I discussed the limitations of evaluation and management by telemedicine and the availability of in person appointments. The patient expressed understanding and agreed to proceed.  History of Present Illness:    Observations/Objective:   Assessment and Plan:   Follow Up Instructions:    I discussed the assessment and treatment plan with the patient. The patient was provided an opportunity to ask questions and all were answered. The patient agreed with the plan and demonstrated an understanding of the instructions.   The patient was advised to call back or seek an in-person evaluation if the symptoms worsen or if the condition fails to improve as anticipated.  I provided 14 minutes of non-face-to-face time during this encounter.   Marlowe Shores, LPN    Review of Systems  Skin: Positive for rash.  Patient denies any fever chills sweats nausea vomiting diarrhea     Objective:   Physical Exam  Patient had virtual visit Appears to be in no distress Atraumatic Neuro able to relate and oriented No apparent resp distress Color normal The rash she shows me appears more like a atopic dermatitis than contact dermatitis. It is  present in the folds of the arms as well as the folds of the leg    Assessment & Plan:  More likely this is atopic dermatitis Course of prednisone along with triamcinolone cream twice daily ongoing Lotions, avoid excessive hot showers Should gradually get better over the course the next few weeks No need to do any other tests at this point in time.

## 2019-01-28 ENCOUNTER — Telehealth: Payer: Self-pay | Admitting: Family Medicine

## 2019-01-28 NOTE — Telephone Encounter (Signed)
Wife states patient still has rash and little better but still itchy and wanting another round antibiotics and cream for the rash. [facility]

## 2019-01-28 NOTE — Telephone Encounter (Signed)
May use triamcinolone cream may have a 90 g amount apply twice daily as needed with 3 refills Prednisone 20 mg 3 daily for 3 days then 2/day for 3 days then 1/day for 3 days #18 If this does not clear up the rash then the next step would be dermatology referral Please notify us if ongoing

## 2019-01-28 NOTE — Telephone Encounter (Signed)
Nurses Please connect with family May have refills of the steroid cream x2 On the most previous visit we did not have him on antibiotics instead we had him on steroids How is the rash doing currently? (Not really sure why they want a antibiotic?  Possibly confusion?  Or possibly they are having something that looks like an infection at this time?)

## 2019-01-28 NOTE — Telephone Encounter (Signed)
They said the prednisone taper helped and it is better but not gone and still itchy. Patient states the cream also helps but it take half a tube at a time to put the cream on the rash because it is on so much of the body- requests a larger tube if possible and refill of prednisone.

## 2019-01-29 MED ORDER — PREDNISONE 20 MG PO TABS: ORAL_TABLET | ORAL | 0 refills | Status: DC

## 2019-01-29 MED ORDER — TRIAMCINOLONE ACETONIDE 0.1 % EX CREA: TOPICAL_CREAM | CUTANEOUS | 1 refills | Status: DC

## 2019-01-29 NOTE — Telephone Encounter (Signed)
Prescriptions sent electronically to pharmacy. Patient notified. °

## 2019-03-01 IMAGING — US US ABDOMEN LIMITED
1 series · 14 of 25 positions shown · non-contrast
Comparison: None.

CLINICAL DATA: Elevated liver enzymes

EXAM:
ULTRASOUND ABDOMEN LIMITED RIGHT UPPER QUADRANT

[Series 1: us abdomen limited · 0.19mm/px · 14 of 67 slices shown]
[im 1/67]
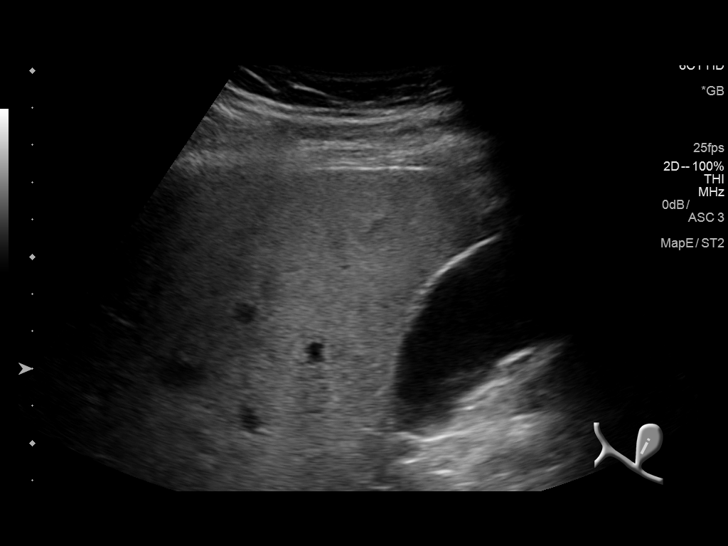
[im 6/67]
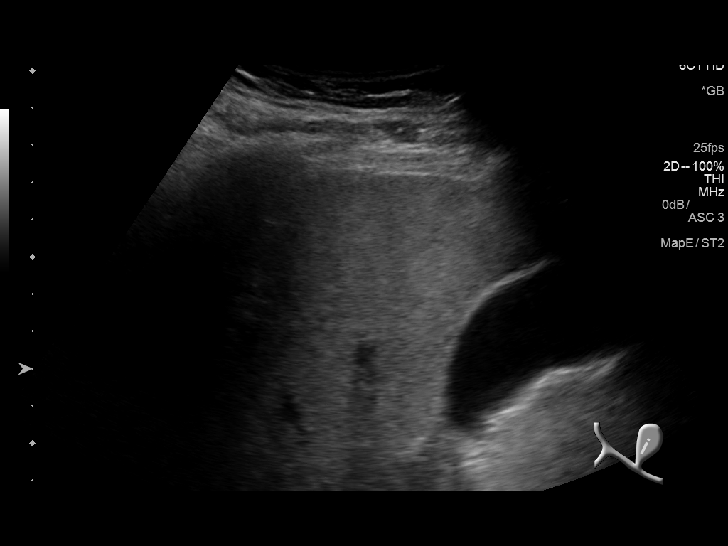
[im 12/67]
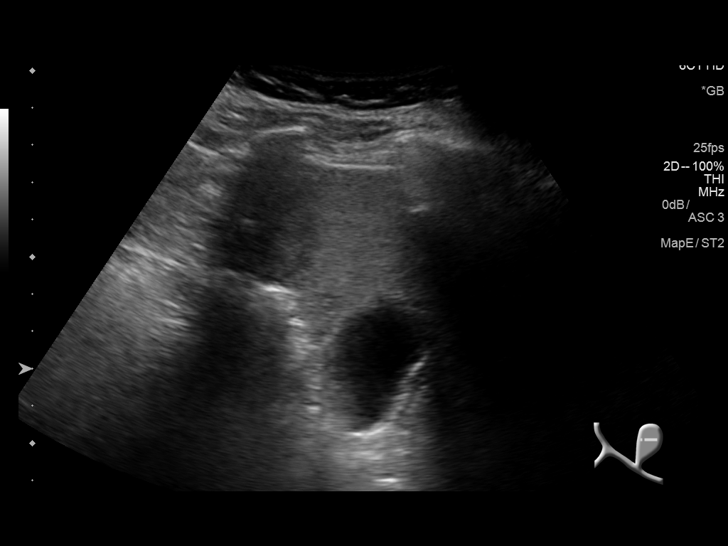
[im 17/67]
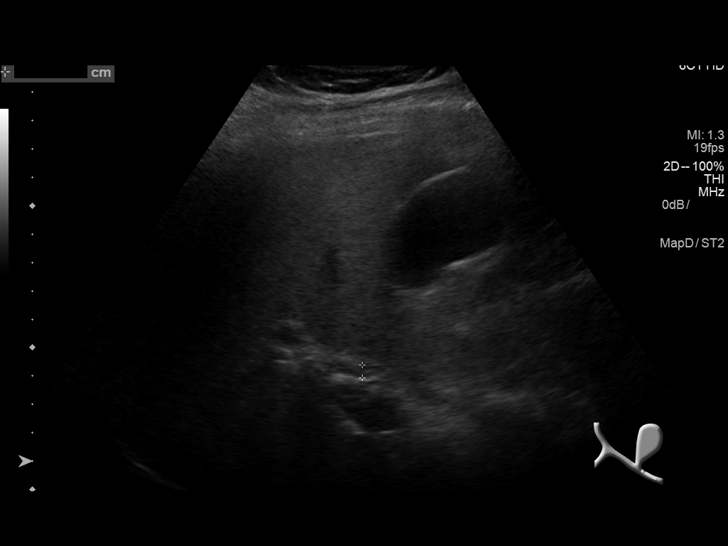
[im 23/67]
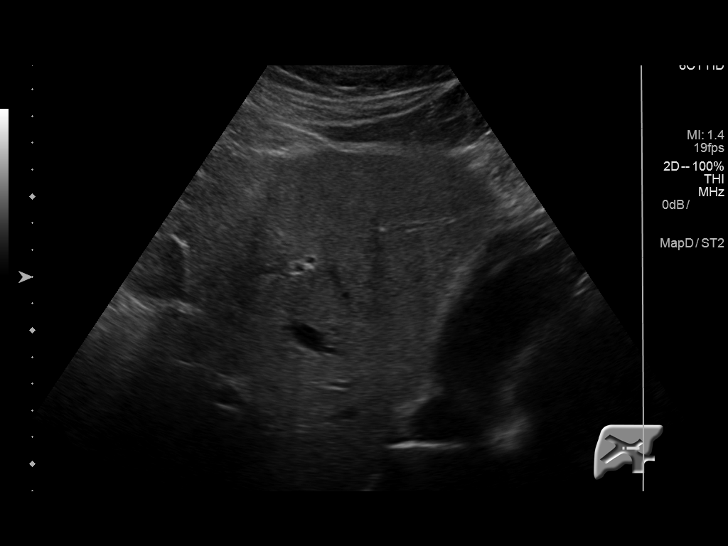
[im 25/67]
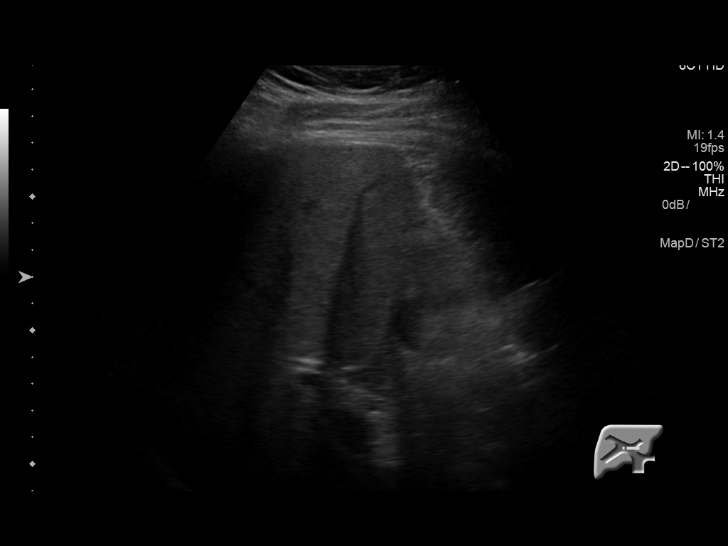
[im 31/67]
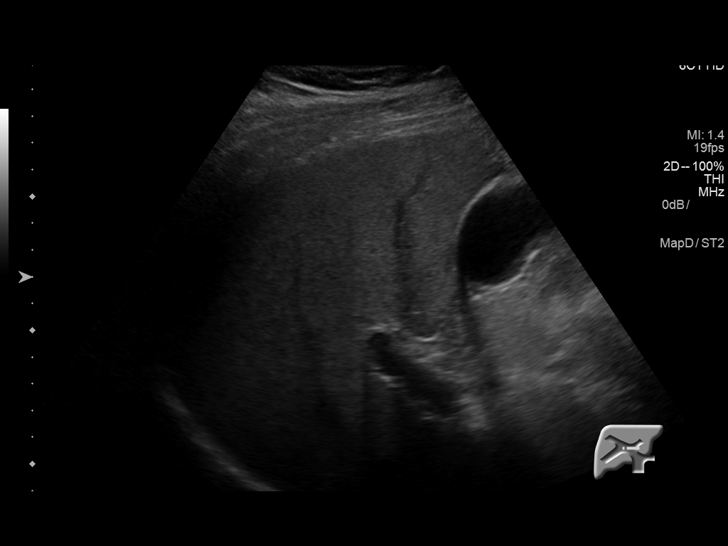
[im 36/67]
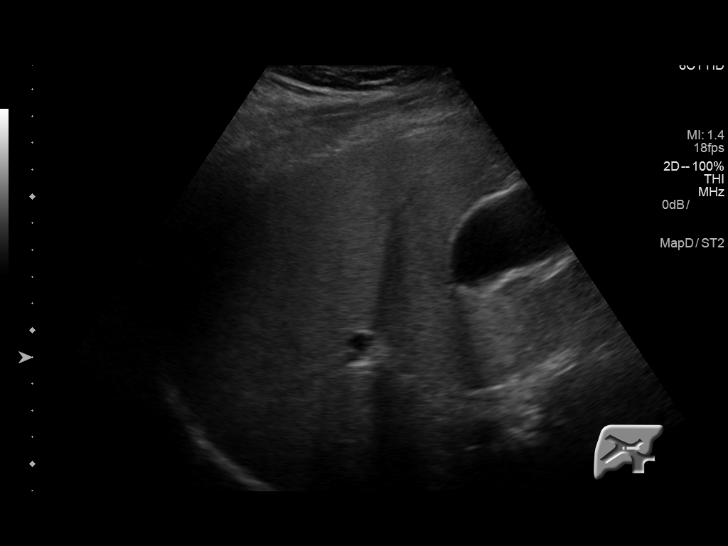
[im 42/67]
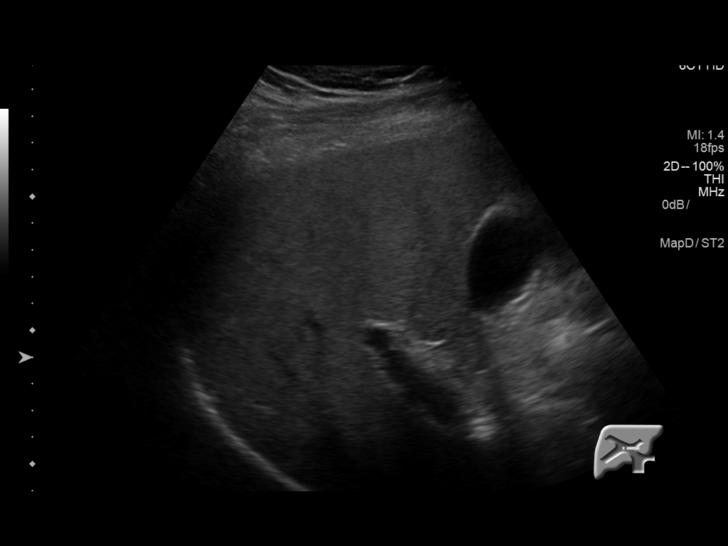
[im 45/67]
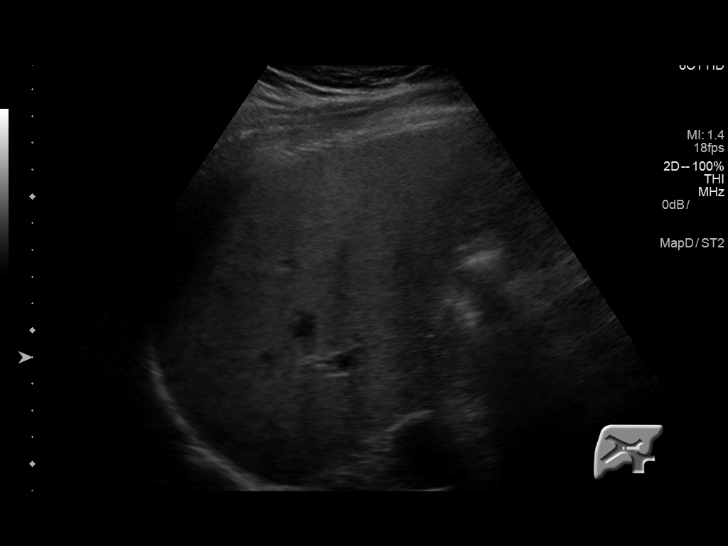
[im 50/67]
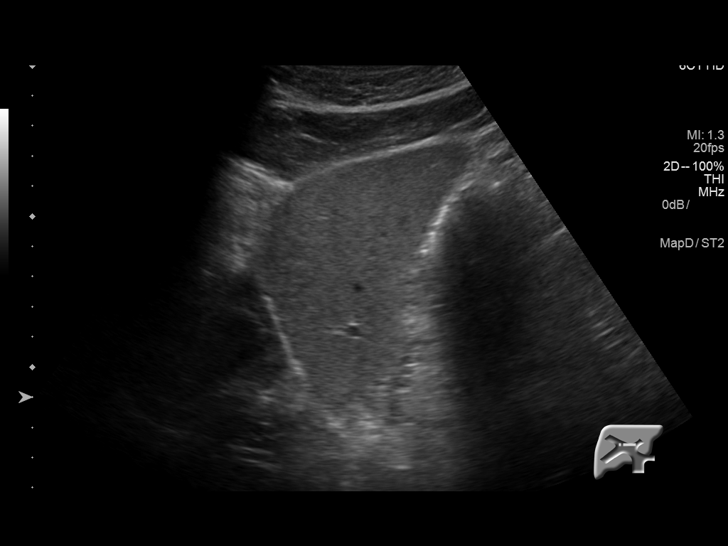
[im 56/67]
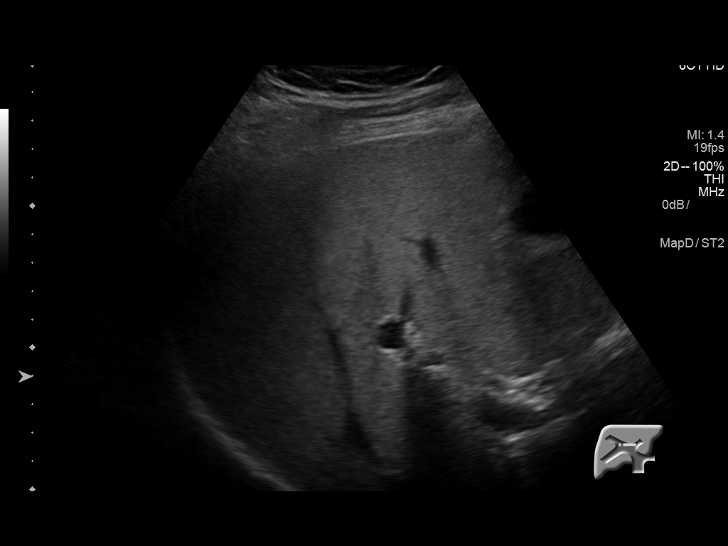
[im 61/67]
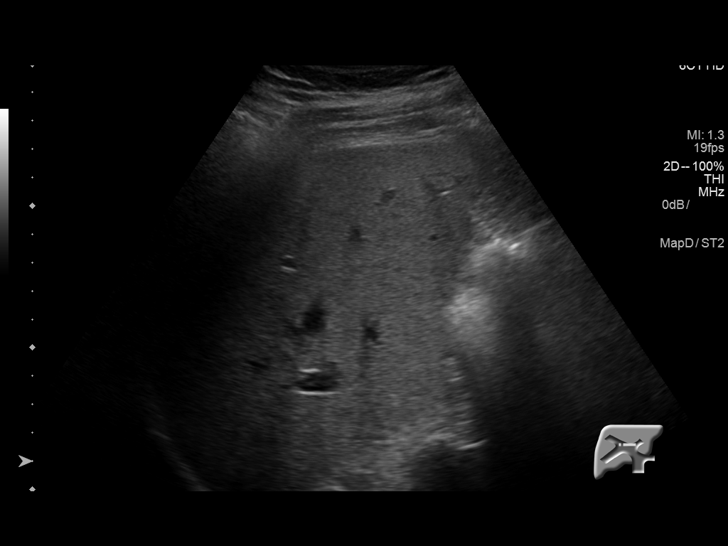
[im 67/67]
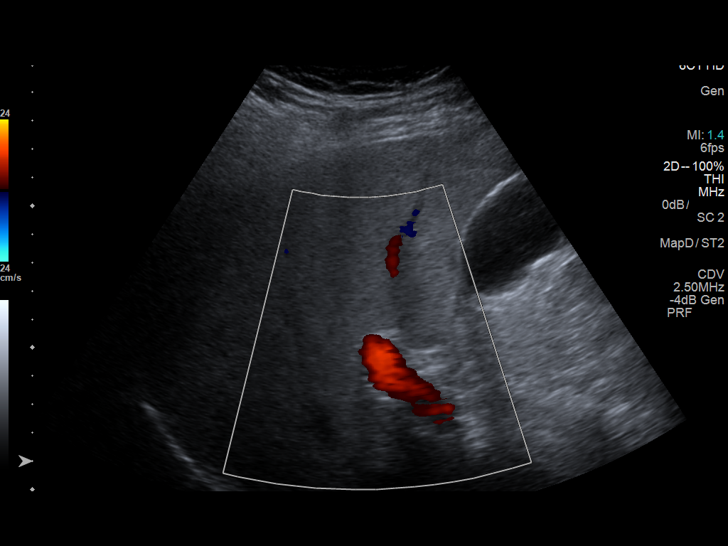

[14 of 25 positions shown; findings below may reference images not displayed]

FINDINGS: Gallbladder:

No gallstones or wall thickening visualized. There is no
pericholecystic fluid. No sonographic Murphy sign noted by
sonographer.

Common bile duct:

Diameter: 5 mm. No intrahepatic or extrahepatic biliary duct
dilatation.

Liver:

No focal lesion identified. Liver echogenicity is overall increased.
Portal vein is patent on color Doppler imaging with normal direction
of blood flow towards the liver.
IMPRESSION: Liver echogenicity is somewhat increased, a finding felt to be
indicative of a degree of hepatic steatosis. While no focal liver
lesions are evident on this study, it must be cautioned that the
sensitivity of ultrasound for detection of focal liver lesions is
diminished in this circumstance.

Study otherwise unremarkable.

## 2019-06-17 ENCOUNTER — Other Ambulatory Visit: Payer: Self-pay | Admitting: Family Medicine

## 2019-09-05 ENCOUNTER — Ambulatory Visit: Payer: 59 | Admitting: Family Medicine

## 2019-09-05 ENCOUNTER — Encounter: Payer: Self-pay | Admitting: Family Medicine

## 2019-09-05 ENCOUNTER — Other Ambulatory Visit: Payer: Self-pay

## 2019-09-05 VITALS — BP 124/88 | Temp 97.5°F | Wt 253.4 lb

## 2019-09-05 DIAGNOSIS — Z79899 Other long term (current) drug therapy: Secondary | ICD-10-CM | POA: Diagnosis not present

## 2019-09-05 DIAGNOSIS — I10 Essential (primary) hypertension: Secondary | ICD-10-CM

## 2019-09-05 DIAGNOSIS — E7849 Other hyperlipidemia: Secondary | ICD-10-CM | POA: Diagnosis not present

## 2019-09-05 MED ORDER — MELOXICAM 15 MG PO TABS: 15.0000 mg | ORAL_TABLET | Freq: Every day | ORAL | 6 refills | Status: DC

## 2019-09-05 MED ORDER — AMLODIPINE BESYLATE 5 MG PO TABS: ORAL_TABLET | ORAL | 1 refills | Status: DC

## 2019-09-05 NOTE — Progress Notes (Signed)
   Subjective:    Patient ID: Isaiah Hicks, male    DOB: [birth year], 39 y.o.   MRN: [id number]  HPI Patient comes in today with complaints of chronic neck pain, back pain and foot pain. States he has had a fusion in the past and disc problems. Saw an online Dr and received rx for meloxicam, this has been helping him and he would like to continue the prescription through Dr. Lorin Picket.  PMH benign  Review of Systems  Constitutional: Negative for diaphoresis and fatigue.  HENT: Negative for congestion and rhinorrhea.   Respiratory: Negative for cough and shortness of breath.   Cardiovascular: Negative for chest pain and leg swelling.  Gastrointestinal: Negative for abdominal pain and diarrhea.  Musculoskeletal: Positive for back pain. Negative for arthralgias.  Skin: Negative for color change and rash.  Neurological: Negative for dizziness and headaches.  Psychiatric/Behavioral: Negative for behavioral problems and confusion.       Objective:   Physical Exam Vitals reviewed.  Constitutional:      General: He is not in acute distress. HENT:     Head: Normocephalic and atraumatic.  Eyes:     General:        Right eye: No discharge.        Left eye: No discharge.  Neck:     Trachea: No tracheal deviation.  Cardiovascular:     Rate and Rhythm: Normal rate and regular rhythm.     Heart sounds: Normal heart sounds. No murmur heard.   Pulmonary:     Effort: Pulmonary effort is normal. No respiratory distress.     Breath sounds: Normal breath sounds.  Lymphadenopathy:     Cervical: No cervical adenopathy.  Skin:    General: Skin is warm and dry.  Neurological:     Mental Status: He is alert.     Coordination: Coordination normal.  Psychiatric:        Behavior: Behavior normal.   Moderate low back tenderness negative straight leg raise        Assessment & Plan:  Low back exercises recommended on a regular basis May use meloxicam on a regular basis Warning signs  regarding GI side effects kidney discussed.

## 2019-09-11 ENCOUNTER — Encounter: Payer: Self-pay | Admitting: Family Medicine

## 2019-09-11 LAB — LIPID PANEL
Chol/HDL Ratio: 4.9 ratio (ref 0.0–5.0)
Cholesterol, Total: 193 mg/dL (ref 100–199)
HDL: 39 mg/dL — ABNORMAL LOW (ref >39)
LDL Chol Calc (NIH): 136 mg/dL — ABNORMAL HIGH (ref 0–99)
Triglycerides: 97 mg/dL (ref 0–149)
VLDL Cholesterol Cal: 18 mg/dL (ref 5–40)

## 2019-09-11 LAB — BASIC METABOLIC PANEL
BUN/Creatinine Ratio: 13 (ref 9–20)
BUN: 14 mg/dL (ref 6–20)
CO2: 24 mmol/L (ref 20–29)
Calcium: 9.8 mg/dL (ref 8.7–10.2)
Chloride: 99 mmol/L (ref 96–106)
Creatinine, Ser: 1.06 mg/dL (ref 0.76–1.27)
GFR calc Af Amer: 102 mL/min/{1.73_m2} (ref >59)
GFR calc non Af Amer: 88 mL/min/{1.73_m2} (ref >59)
Glucose: 86 mg/dL (ref 65–99)
Potassium: 4.7 mmol/L (ref 3.5–5.2)
Sodium: 136 mmol/L (ref 134–144)

## 2019-09-11 LAB — ALPHA-GAL PANEL
Alpha Gal IgE*: <0.1 kU/L (ref <0.10)
Beef (Bos spp) IgE: <0.1 kU/L (ref <0.35)
Class Interpretation: 0
Class Interpretation: 0
Class Interpretation: 0
Lamb/Mutton (Ovis spp) IgE: <0.1 kU/L (ref <0.35)
Pork (Sus spp) IgE: <0.1 kU/L (ref <0.35)

## 2019-09-11 LAB — HEPATIC FUNCTION PANEL
ALT: 31 IU/L (ref 0–44)
AST: 27 IU/L (ref 0–40)
Albumin: 4.8 g/dL (ref 4.0–5.0)
Alkaline Phosphatase: 96 IU/L (ref 48–121)
Bilirubin Total: 1.5 mg/dL — ABNORMAL HIGH (ref 0.0–1.2)
Bilirubin, Direct: 0.29 mg/dL (ref 0.00–0.40)
Total Protein: 7.4 g/dL (ref 6.0–8.5)

## 2019-09-22 ENCOUNTER — Telehealth (INDEPENDENT_AMBULATORY_CARE_PROVIDER_SITE_OTHER): Payer: 59 | Admitting: Family Medicine

## 2019-09-22 ENCOUNTER — Telehealth: Payer: Self-pay | Admitting: Family Medicine

## 2019-09-22 ENCOUNTER — Other Ambulatory Visit: Payer: Self-pay

## 2019-09-22 ENCOUNTER — Other Ambulatory Visit: Payer: Self-pay | Admitting: *Deleted

## 2019-09-22 DIAGNOSIS — U071 COVID-19: Secondary | ICD-10-CM | POA: Diagnosis not present

## 2019-09-22 MED ORDER — ONDANSETRON HCL 8 MG PO TABS: 8.0000 mg | ORAL_TABLET | Freq: Three times a day (TID) | ORAL | 1 refills | Status: AC | PRN

## 2019-09-22 NOTE — Telephone Encounter (Signed)
Discussed with pt's wife and she verbalized understanding. Zofran sent to pharm. Appt made for today and I called infusion hotline and gave pt info.

## 2019-09-22 NOTE — Progress Notes (Signed)
   Subjective:    Patient ID: Isaiah Hicks, male    DOB: October 17, 1980, 39 y.o.   MRN: [id number]  HPI Pt began to have runny nose and headache on Saturday. Sunday began with headache, body ache, fever, sore throat, cough, nausea. Pt has been taking Tylenol, Motrin, Zofran. Pt took a home test and it was positive. Did a drive thru test today but will not have results until tomorrow. Patient relates some coughing and feeling fatigued tired and rundown.  Feels a little short of breath when he pushes himself Does have underlying history of blood pressure issues as well as mild obesity Virtual Visit via Telephone Note  I connected with Isaiah Hicks on 09/22/19 at  4:30 PM EDT by telephone and verified that I am speaking with the correct person using two identifiers.  Location: Patient: home Provider: office   I discussed the limitations, risks, security and privacy concerns of performing an evaluation and management service by telephone and the availability of in person appointments. I also discussed with the patient that there may be a patient responsible charge related to this service. The patient expressed understanding and agreed to proceed.   History of Present Illness:    Observations/Objective:   Assessment and Plan:   Follow Up Instructions:    I discussed the assessment and treatment plan with the patient. The patient was provided an opportunity to ask questions and all were answered. The patient agreed with the plan and demonstrated an understanding of the instructions.   The patient was advised to call back or seek an in-person evaluation if the symptoms worsen or if the condition fails to improve as anticipated.  I provided 20 minutes of non-face-to-face time during this encounter.  Including Documentation      Review of Systems Patient relates some slight chest congestion relates some low-grade fever body aches not feeling good denies diarrhea relates nausea  but no vomiting     Objective:   Physical Exam Today's visit was via telephone Physical exam was not possible for this visit   O2 saturation 97/98 when sitting up dropped to 91 when he lays down    Assessment & Plan:  Covid No work this week Recommend infusion Referral made Warning signs of when to go to ER discussed Patient was encouraged to lay on his side or potentially on his abdomen when he sleeps for sleep propped up

## 2019-09-22 NOTE — Telephone Encounter (Signed)
I talked with pt's wife when I called her on the other message today and told her zofran was sent in and to go to ED if O2 drops below 90. She states it has been holding at 91. He has virtual visit today at 4:30

## 2019-09-22 NOTE — Telephone Encounter (Signed)
Pt wife is calling back wanting to know how pulsox to stay at 90 and his heart rate at 66 what level will she need to take hospital. He is wanting to get the zofran  called in to pharmacy today if possible   Pt call back [phone]

## 2019-09-22 NOTE — Telephone Encounter (Signed)
Last he tested home test for Covid that was positive he is in [place] now take actual test now he has a fever of 102 nausea. Wife is wanting to see if zofran could be called in and wants to see if he can be sent for Antibody infusion. [facility] in [place] on [address]   Pt call back [phone]

## 2019-09-22 NOTE — Telephone Encounter (Signed)
May have Zofran 8 mg, 1 taken 3 times daily as needed for nausea, #15, 1 refill Nurses our office should call the infusion hotline [phone] simply leave a message on the hotline that the patient has a positive Covid test has underlying hypertension and obesity and include his medical record number phone number date of birth (infusion center prefers that the providers office call the hotline not the patient) Let Isaiah Hicks know that hotline should reach out to him in the next 48 hours Also I would like to do a virtual or phone visit with the patient today for 4:30 pm is fine

## 2019-09-22 NOTE — Telephone Encounter (Signed)
Mr. rowdy, guerrini are scheduled for a virtual visit with your provider today.    Just as we do with appointments in the office, we must obtain your consent to participate.  Your consent will be active for this visit and any virtual visit you may have with one of our providers in the next 365 days.    If you have a MyChart account, I can also send a copy of this consent to you electronically.  All virtual visits are billed to your insurance company just like a traditional visit in the office.  As this is a virtual visit, video technology does not allow for your provider to perform a traditional examination.  This may limit your provider's ability to fully assess your condition.  If your provider identifies any concerns that need to be evaluated in person or the need to arrange testing such as labs, EKG, etc, we will make arrangements to do so.    Although advances in technology are sophisticated, we cannot ensure that it will always work on either your end or our end.  If the connection with a video visit is poor, we may have to switch to a telephone visit.  With either a video or telephone visit, we are not always able to ensure that we have a secure connection.   I need to obtain your verbal consent now.   Are you willing to proceed with your visit today?   Deejay Koppelman Orrick has provided verbal consent on 09/22/2019 for a virtual visit (video or telephone).   Marlowe Shores, LPN 10/07/2444  2:86 PM

## 2019-09-22 NOTE — Telephone Encounter (Signed)
Thank you :)

## 2019-09-23 ENCOUNTER — Encounter (HOSPITAL_COMMUNITY): Payer: Self-pay

## 2019-09-23 ENCOUNTER — Ambulatory Visit: Payer: 59 | Admitting: Family Medicine

## 2019-09-23 ENCOUNTER — Inpatient Hospital Stay (HOSPITAL_COMMUNITY)
Admission: EM | Admit: 2019-09-23 | Discharge: 2019-09-24 | DRG: 177 | Disposition: A | Discharge status: Home / Self Care | Payer: 59 | Attending: Internal Medicine | Admitting: Internal Medicine

## 2019-09-23 ENCOUNTER — Other Ambulatory Visit: Payer: Self-pay

## 2019-09-23 ENCOUNTER — Other Ambulatory Visit: Payer: Self-pay | Admitting: Critical Care Medicine

## 2019-09-23 ENCOUNTER — Emergency Department (HOSPITAL_COMMUNITY): Payer: 59

## 2019-09-23 ENCOUNTER — Ambulatory Visit (INDEPENDENT_AMBULATORY_CARE_PROVIDER_SITE_OTHER): Payer: 59 | Admitting: Family Medicine

## 2019-09-23 DIAGNOSIS — Z888 Allergy status to other drugs, medicaments and biological substances status: Secondary | ICD-10-CM | POA: Diagnosis not present

## 2019-09-23 DIAGNOSIS — I10 Essential (primary) hypertension: Secondary | ICD-10-CM

## 2019-09-23 DIAGNOSIS — R0902 Hypoxemia: Secondary | ICD-10-CM | POA: Diagnosis not present

## 2019-09-23 DIAGNOSIS — Z791 Long term (current) use of non-steroidal anti-inflammatories (NSAID): Secondary | ICD-10-CM

## 2019-09-23 DIAGNOSIS — Z79899 Other long term (current) drug therapy: Secondary | ICD-10-CM | POA: Diagnosis not present

## 2019-09-23 DIAGNOSIS — E785 Hyperlipidemia, unspecified: Secondary | ICD-10-CM | POA: Diagnosis present

## 2019-09-23 DIAGNOSIS — K760 Fatty (change of) liver, not elsewhere classified: Secondary | ICD-10-CM | POA: Diagnosis present

## 2019-09-23 DIAGNOSIS — E669 Obesity, unspecified: Secondary | ICD-10-CM | POA: Diagnosis present

## 2019-09-23 DIAGNOSIS — Z8249 Family history of ischemic heart disease and other diseases of the circulatory system: Secondary | ICD-10-CM | POA: Diagnosis not present

## 2019-09-23 DIAGNOSIS — Z981 Arthrodesis status: Secondary | ICD-10-CM

## 2019-09-23 DIAGNOSIS — Z881 Allergy status to other antibiotic agents status: Secondary | ICD-10-CM | POA: Diagnosis not present

## 2019-09-23 DIAGNOSIS — J9601 Acute respiratory failure with hypoxia: Secondary | ICD-10-CM | POA: Diagnosis present

## 2019-09-23 DIAGNOSIS — U071 COVID-19: Secondary | ICD-10-CM | POA: Diagnosis present

## 2019-09-23 DIAGNOSIS — J1282 Pneumonia due to coronavirus disease 2019: Secondary | ICD-10-CM

## 2019-09-23 DIAGNOSIS — Z6833 Body mass index (BMI) 33.0-33.9, adult: Secondary | ICD-10-CM

## 2019-09-23 LAB — CBC WITH DIFFERENTIAL/PLATELET
Abs Immature Granulocytes: 0.02 10*3/uL (ref 0.00–0.07)
Basophils Absolute: 0 10*3/uL (ref 0.0–0.1)
Basophils Relative: 0 %
Eosinophils Absolute: 0 10*3/uL (ref 0.0–0.5)
Eosinophils Relative: 0 %
HCT: 44 % (ref 39.0–52.0)
Hemoglobin: 14.3 g/dL (ref 13.0–17.0)
Immature Granulocytes: 0 %
Lymphocytes Relative: 15 %
Lymphs Abs: 0.7 10*3/uL (ref 0.7–4.0)
MCH: 26.6 pg (ref 26.0–34.0)
MCHC: 32.5 g/dL (ref 30.0–36.0)
MCV: 81.8 fL (ref 80.0–100.0)
Monocytes Absolute: 0.6 10*3/uL (ref 0.1–1.0)
Monocytes Relative: 12 %
Neutro Abs: 3.5 10*3/uL (ref 1.7–7.7)
Neutrophils Relative %: 73 %
Platelets: 159 10*3/uL (ref 150–400)
RBC: 5.38 MIL/uL (ref 4.22–5.81)
RDW: 13.8 % (ref 11.5–15.5)
WBC: 4.8 10*3/uL (ref 4.0–10.5)
nRBC: 0 % (ref 0.0–0.2)

## 2019-09-23 LAB — FIBRINOGEN: Fibrinogen: 309 mg/dL (ref 210–475)

## 2019-09-23 LAB — D-DIMER, QUANTITATIVE: D-Dimer, Quant: 0.34 ug/mL-FEU (ref 0.00–0.50)

## 2019-09-23 LAB — COMPREHENSIVE METABOLIC PANEL
ALT: 38 U/L (ref 0–44)
AST: 36 U/L (ref 15–41)
Albumin: 4.3 g/dL (ref 3.5–5.0)
Alkaline Phosphatase: 62 U/L (ref 38–126)
Anion gap: 8 (ref 5–15)
BUN: 16 mg/dL (ref 6–20)
CO2: 24 mmol/L (ref 22–32)
Calcium: 8.6 mg/dL — ABNORMAL LOW (ref 8.9–10.3)
Chloride: 103 mmol/L (ref 98–111)
Creatinine, Ser: 1.04 mg/dL (ref 0.61–1.24)
GFR calc Af Amer: >60 mL/min (ref >60)
GFR calc non Af Amer: >60 mL/min (ref >60)
Glucose, Bld: 107 mg/dL — ABNORMAL HIGH (ref 70–99)
Potassium: 3.6 mmol/L (ref 3.5–5.1)
Sodium: 135 mmol/L (ref 135–145)
Total Bilirubin: 0.7 mg/dL (ref 0.3–1.2)
Total Protein: 7 g/dL (ref 6.5–8.1)

## 2019-09-23 LAB — LACTIC ACID, PLASMA
Lactic Acid, Venous: 1.2 mmol/L (ref 0.5–1.9)
Lactic Acid, Venous: 1 mmol/L (ref 0.5–1.9)

## 2019-09-23 LAB — C-REACTIVE PROTEIN: CRP: 0.8 mg/dL (ref <1.0)

## 2019-09-23 LAB — FERRITIN: Ferritin: 153 ng/mL (ref 24–336)

## 2019-09-23 LAB — TRIGLYCERIDES: Triglycerides: 66 mg/dL (ref <150)

## 2019-09-23 LAB — SARS CORONAVIRUS 2 BY RT PCR (HOSPITAL ORDER, PERFORMED IN ~~LOC~~ HOSPITAL LAB): SARS Coronavirus 2: POSITIVE — AB

## 2019-09-23 LAB — PROCALCITONIN: Procalcitonin: <0.1 ng/mL

## 2019-09-23 LAB — LACTATE DEHYDROGENASE: LDH: 122 U/L (ref 98–192)

## 2019-09-23 MED ORDER — DEXAMETHASONE SODIUM PHOSPHATE 10 MG/ML IJ SOLN
6.0000 mg | Freq: Once | INTRAMUSCULAR | Status: AC
  Administered 2019-09-23: 6 mg via INTRAVENOUS
  Filled 2019-09-23: qty 1

## 2019-09-23 MED ORDER — TRAMADOL HCL 50 MG PO TABS
50.0000 mg | ORAL_TABLET | Freq: Once | ORAL | Status: AC
  Administered 2019-09-23: 50 mg via ORAL
  Filled 2019-09-23: qty 1

## 2019-09-23 MED ORDER — SODIUM CHLORIDE 0.9 % IV SOLN
100.0000 mg | INTRAVENOUS | Status: AC
  Administered 2019-09-23 (×2): 100 mg via INTRAVENOUS
  Filled 2019-09-23 (×2): qty 20

## 2019-09-23 MED ORDER — SODIUM CHLORIDE 0.9 % IV SOLN
100.0000 mg | Freq: Every day | INTRAVENOUS | Status: DC
  Administered 2019-09-24: 100 mg via INTRAVENOUS
  Filled 2019-09-23: qty 20

## 2019-09-23 MED ORDER — PROMETHAZINE HCL 25 MG/ML IJ SOLN
12.5000 mg | Freq: Once | INTRAMUSCULAR | Status: AC
  Administered 2019-09-23: 12.5 mg via INTRAVENOUS
  Filled 2019-09-23: qty 1

## 2019-09-23 NOTE — ED Notes (Signed)
Patient given a sandwich and chips.

## 2019-09-23 NOTE — ED Triage Notes (Signed)
Pt positive for covid, sent to ER by Dr. Gerda Diss for o2 sat in 80's.  Pt says o2 sat fluctuates with movement and position changes.  C/O generalized body aches and nausea.

## 2019-09-23 NOTE — Progress Notes (Signed)
   Subjective:    Patient ID: Isaiah Hicks, male    DOB: [birth year], 39 y.o.   MRN: [id number]  HPIFollow up on covid. Oxygen levels in the 80's this morning. Not eating.  This patient had a virtual visit yesterday.  Patient had onset of symptoms on Saturday.  Patient unvaccinated.  Patient relates a lot of body aches headache fatigue tiredness chest congestion and shortness of breath.  States last night his O2 sats were in the 80s his wife encouraged him to go to the ER he did not want to go he states that when he sat up or moves around it went back up into the low 90s.  He relates that he feels faint like he is going to pass out and states he has no energy and he feels out of breath if he pushes himself no vomiting but does have nausea   Review of Systems See above    Objective:   Physical Exam Heart is tachycardic lungs diminished breath sounds no wheezing was detected O2 saturation 86 and 88% on 2 different attempts extremities no edema patient extremely weak       Assessment & Plan:  How rapidly he is getting sick is concerning He is scheduled for infusion tomorrow but I believe it is in his best interest to go to the ER immediately.  ER doctor was spoken with.  Triage was called but unable to get through.  Wife will take him to the ER immediately for further evaluation.  If his O2 saturations are in the 80s more than likely he will stay and be treated with remdesivir and steroids.

## 2019-09-23 NOTE — Progress Notes (Signed)
I connected by phone with Isaiah Hicks on 09/23/2019 at 11:31 AM to discuss the potential use of a new treatment for mild to moderate COVID-19 viral infection in non-hospitalized patients.  This patient is a 39 y.o. male that meets the FDA criteria for Emergency Use Authorization of COVID monoclonal antibody casirivimab/imdevimab.  Has a (+) direct SARS-CoV-2 viral test result  Has mild or moderate COVID-19   Is NOT hospitalized due to COVID-19  Is within 10 days of symptom onset  Has at least one of the high risk factor(s) for progression to severe COVID-19 and/or hospitalization as defined in EUA.  Specific high risk criteria : BMI > 25 and Cardiovascular disease or hypertension   I have spoken and communicated the following to the patient or parent/caregiver regarding COVID monoclonal antibody treatment:  1. FDA has authorized the emergency use for the treatment of mild to moderate COVID-19 in adults and pediatric patients with positive results of direct SARS-CoV-2 viral testing who are 13 years of age and older weighing at least 40 kg, and who are at high risk for progressing to severe COVID-19 and/or hospitalization.  2. The significant known and potential risks and benefits of COVID monoclonal antibody, and the extent to which such potential risks and benefits are unknown.  3. Information on available alternative treatments and the risks and benefits of those alternatives, including clinical trials.  4. Patients treated with COVID monoclonal antibody should continue to self-isolate and use infection control measures (e.g., wear mask, isolate, social distance, avoid sharing personal items, clean and disinfect "high touch" surfaces, and frequent handwashing) according to CDC guidelines.   5. The patient or parent/caregiver has the option to accept or refuse COVID monoclonal antibody treatment.  After reviewing this information with the patient, The patient agreed to proceed with  receiving casirivimab\imdevimab infusion and will be provided a copy of the Fact sheet prior to receiving the infusion. [name] 09/23/2019 11:31 AM

## 2019-09-23 NOTE — H&P (Signed)
History and Physical    Isaiah Hicks BUL:[id number] DOB: [birth year] DOA: 09/23/2019  PCP: Babs Sciara, MD   Patient coming from: home  I have personally briefly reviewed patient's old medical records in [facility]  Chief Complaint: Sob, Covid positive  HPI: Isaiah Hicks is a 39 y.o. male with medical history significant for hypertension, obesity, fatty liver, dyslipidemia. Patient presented to the ED with complaints of body aches, difficulty breathing and cough with fever.  Patient tested positive for Covid-outside facility in [place].  Patient saw his primary care provider today, O2 sats were 86% in the office, he was sent to the ED. patient was scheduled for outpatient antibody infusion tomorrow. He denies chest pain, no leg swelling.  Reports episode of dry heaving, but no actual vomiting, no loose stools.  ED Course: O2 sats 90 to 95% on room air, dropped to 88% with ambulation.  Improved with 2 L oxygen.  Normal lactic acid.  WBC 4.8.  Procalcitonin less than 0.1.  Inflammatory markers unremarkable.  Portable chest x-ray showing mild bibasilar airspace disease atelectasis, pneumonia was not excluded.  Remdesivir and dexamethasone started.  Hospitalist admit for Covid pneumonia with hypoxia.  Review of Systems: As per HPI all other systems reviewed and negative.  Past Medical History:  Diagnosis Date  . Degenerative disc disease   . Hypertension     Past Surgical History:  Procedure Laterality Date  . c5-6 fusin    . CERVICAL FUSION     c 5 and c 6  . TONSILLECTOMY    . WISDOM TOOTH EXTRACTION       reports that he has never smoked. He has never used smokeless tobacco. He reports that he does not drink alcohol and does not use drugs.  Allergies  Allergen Reactions  . Zithromax [Azithromycin] Nausea And Vomiting  . Lisinopril Cough    Family History  Problem Relation Age of Onset  . Hypertension Father     Prior to Admission medications     Medication Sig Start Date End Date Taking? Authorizing Provider  amLODipine (NORVASC) 5 MG tablet TAKE 1 TABLET(5 MG) BY MOUTH DAILY Patient taking differently: Take 5 mg by mouth daily.  09/05/19  Yes Babs Sciara, MD  ibuprofen (ADVIL) 200 MG tablet Take 800 mg by mouth every 6 (six) hours as needed for mild pain.   Yes [provider]  meloxicam (MOBIC) 15 MG tablet Take 1 tablet (15 mg total) by mouth daily. 09/05/19  Yes Babs Sciara, MD  ondansetron (ZOFRAN) 8 MG tablet Take 1 tablet (8 mg total) by mouth every 8 (eight) hours as needed for nausea or vomiting. 09/22/19  Yes Babs Sciara, MD    Physical Exam: Vitals:   09/23/19 1530 09/23/19 1600 09/23/19 1618 09/23/19 1620  BP: (!) 141/80 (!) 141/78 131/79   Pulse: 83 85 90   Resp: (!) 21 (!) 21 (!) 26   Temp:      TempSrc:      SpO2: (!) 88% 90% 93% (!) 88%  Weight:      Height:        Constitutional: NAD, calm, comfortable Vitals:   09/23/19 1530 09/23/19 1600 09/23/19 1618 09/23/19 1620  BP: (!) 141/80 (!) 141/78 131/79   Pulse: 83 85 90   Resp: (!) 21 (!) 21 (!) 26   Temp:      TempSrc:      SpO2: (!) 88% 90% 93% (!) 88%  Weight:  Height:       Eyes: PERRL, lids and conjunctivae normal ENMT: Mucous membranes are moist.  Neck: normal, supple, no masses, no thyromegaly Respiratory: Normal respiratory effort. No accessory muscle use.  Cardiovascular: Regular rate and rhythm, no murmurs / rubs / gallops. No extremity edema. 2+ pedal pulses.  Abdomen: no tenderness, no masses palpated. No hepatosplenomegaly. Bowel sounds positive.  Musculoskeletal: no clubbing / cyanosis. No joint deformity upper and lower extremities. Good ROM, no contractures. Normal muscle tone.  Skin: no rashes, lesions, ulcers. No induration Neurologic: No apparent cranial nerve abnormality, moving extremities spontaneously Psychiatric: Normal judgment and insight. Alert and oriented x 3. Normal mood.   Labs on Admission: I  have personally reviewed following labs and imaging studies  CBC: Recent Labs  Lab 09/23/19 1415  WBC 4.8  NEUTROABS 3.5  HGB 14.3  HCT 44.0  MCV 81.8  PLT 159   Basic Metabolic Panel: Recent Labs  Lab 09/23/19 1415  NA 135  K 3.6  CL 103  CO2 24  GLUCOSE 107*  BUN 16  CREATININE 1.04  CALCIUM 8.6*   GFR: Estimated Creatinine Clearance: 124 mL/min (by C-G formula based on SCr of 1.04 mg/dL). Liver Function Tests: Recent Labs  Lab 09/23/19 1415  AST 36  ALT 38  ALKPHOS 62  BILITOT 0.7  PROT 7.0  ALBUMIN 4.3   Lipid Profile: Recent Labs    09/23/19 1419  TRIG 66   Anemia Panel: Recent Labs    09/23/19 1419  FERRITIN 153   Urine analysis:    Component Value Date/Time   COLORURINE YELLOW 08/08/2010 0720   APPEARANCEUR CLEAR 08/08/2010 0720   LABSPEC 1.022 08/08/2010 0720   PHURINE 6.5 08/08/2010 0720   GLUCOSEU NEGATIVE 08/08/2010 0720   HGBUR NEGATIVE 08/08/2010 0720   BILIRUBINUR NEGATIVE 08/08/2010 0720   KETONESUR NEGATIVE 08/08/2010 0720   PROTEINUR NEGATIVE 08/08/2010 0720   UROBILINOGEN 1.0 08/08/2010 0720   NITRITE NEGATIVE 08/08/2010 0720   LEUKOCYTESUR NEGATIVE 08/08/2010 0720    Radiological Exams on Admission: DG Chest Portable 1 View  Result Date: 09/23/2019 CLINICAL DATA:  Short of breath.  COVID positive EXAM: PORTABLE CHEST 1 VIEW COMPARISON:  12/29/2013 FINDINGS: Mild bibasilar airspace disease. Decreased lung volume. No focal effusion. Heart size and vascularity normal. IMPRESSION: Decreased lung volume with mild bibasilar airspace disease most likely atelectasis. Follow-up recommended to exclude developing pneumonia. Electronically Signed   By: Marlan Palau M.D.   On: 09/23/2019 14:15    EKG: Independently reviewed.   Assessment/Plan Principal Problem:   Pneumonia due to COVID-19 virus Active Problems:   Essential hypertension  Pneumonia due to COVID-19 infection with acute hypoxic respiratory failure-O2 sats down to  88% on room air with ambulation.  Portable chest x-ray shows atelectasis, pneumonia not excluded.  D-dimer 0.34 WNL. Inflammatory markers unremarkable.  Procalcitonin less than 0.1. -Continue remdesivir and dexamethasone -COVID-19 admission protocol -As needed mucolytic's, flutter valve, incentive spirometry, inhalers -Trend inflammatory markers daily -Multivitamins zinc, vitamin C -CBC, CMP daily  Hypertension-stable. -Resume Norvasc   DVT prophylaxis: Lovenox Code Status: Full code Family Communication: None at bedside Disposition Plan: Greater than 2 days pending respiratory status with COVID-19 pneumonia. Consults called: None Admission status: Inpatient, telemetry I certify that at the point of admission it is my clinical judgment that the patient will require inpatient hospital care spanning beyond 2 midnights from the point of admission due to high intensity of service, high risk for further deterioration and high frequency of surveillance required.  Onnie Boer MD [facility]  09/23/2019, 7:12 PM

## 2019-09-23 NOTE — ED Notes (Signed)
2L O2 via [place] applied. Patient breathing even and nonlabored at rest.

## 2019-09-23 NOTE — ED Provider Notes (Signed)
[facility] EMERGENCY DEPARTMENT Provider Note   CSN: [id number] Arrival date & time: 09/23/19  1221     History Chief Complaint  Patient presents with  . Isaiah Hicks is a 39 y.o. male with a history of hypertension only, tested positive for COVID-19 yesterday to help with diagnostics and [place], presenting with increasing shortness of breath and oxygen saturations into the 80s intermittently along with shortness of breath (was 86% in MD's office this am),  nausea without emesis, generalized body aches and fatigue. He denies chest pain except when coughing. His cough has sometimes been productive of a white to yellow sputum. He was seen by his PCP today who sent him here for further evaluation and probable admission. He states that his oxygen level actually improves with standing and ambulation and worsens when supine. He has taken ibuprofen for fever reduction, reporting T-max of 103. He is also been taking Zofran for nausea which has failed to work today. He also endorses clear rhinorrhea and mild headache which were his first symptoms of this illness. He has not been vaccinated for Covid.  HPI     Past Medical History:  Diagnosis Date  . Degenerative disc disease   . Hypertension     Patient Active Problem List   Diagnosis Date Noted  . Pneumonia due to COVID-19 virus 09/23/2019  . Fatty liver 07/03/2017  . Obesity (BMI 35.0-39.9 without comorbidity) 03/28/2017  . Essential hypertension 02/23/2017  . Hyperlipidemia 05/25/2015    Past Surgical History:  Procedure Laterality Date  . c5-6 fusin    . CERVICAL FUSION     c 5 and c 6  . TONSILLECTOMY    . WISDOM TOOTH EXTRACTION         Family History  Problem Relation Age of Onset  . Hypertension Father     Social History   Tobacco Use  . Smoking status: Never Smoker  . Smokeless tobacco: Never Used  Substance Use Topics  . Alcohol use: No    Alcohol/week: 0.0 standard drinks  . Drug use: No     Home Medications Prior to Admission medications   Medication Sig Start Date End Date Taking? Authorizing Provider  amLODipine (NORVASC) 5 MG tablet TAKE 1 TABLET(5 MG) BY MOUTH DAILY Patient taking differently: Take 5 mg by mouth daily.  09/05/19  Yes Babs Sciara, MD  ibuprofen (ADVIL) 200 MG tablet Take 800 mg by mouth every 6 (six) hours as needed for mild pain.   Yes [provider]  meloxicam (MOBIC) 15 MG tablet Take 1 tablet (15 mg total) by mouth daily. 09/05/19  Yes Babs Sciara, MD  ondansetron (ZOFRAN) 8 MG tablet Take 1 tablet (8 mg total) by mouth every 8 (eight) hours as needed for nausea or vomiting. 09/22/19  Yes Babs Sciara, MD    Allergies    Zithromax [azithromycin] and Lisinopril  Review of Systems   Review of Systems  Constitutional: Positive for chills, fatigue and fever.  HENT: Positive for rhinorrhea. Negative for congestion, ear pain, sinus pressure, sore throat, trouble swallowing and voice change.   Eyes: Negative for discharge.  Respiratory: Positive for cough and shortness of breath. Negative for wheezing and stridor.   Cardiovascular: Negative for chest pain.  Gastrointestinal: Positive for nausea. Negative for abdominal pain, diarrhea and vomiting.  Genitourinary: Negative.   Musculoskeletal: Positive for myalgias.  Neurological: Positive for weakness and headaches.    Physical Exam Updated Vital Signs BP  131/79   Pulse 90   Temp 99.7 F (37.6 C) (Oral)   Resp (!) 26   Ht 6' (1.829 m)   Wt 113.4 kg   SpO2 (!) 88%   BMI 33.91 kg/m   Physical Exam Vitals and nursing note reviewed.  Constitutional:      Appearance: He is well-developed.  HENT:     Head: Normocephalic and atraumatic.  Eyes:     Conjunctiva/sclera: Conjunctivae normal.  Cardiovascular:     Rate and Rhythm: Normal rate and regular rhythm.     Heart sounds: Normal heart sounds.  Pulmonary:     Effort: Pulmonary effort is normal.     Breath sounds:  Decreased breath sounds present. No wheezing, rhonchi or rales.     Comments: Reduced breath sounds throughout. Abdominal:     General: Bowel sounds are normal.     Palpations: Abdomen is soft.     Tenderness: There is no abdominal tenderness.  Musculoskeletal:        General: Normal range of motion.     Cervical back: Normal range of motion.  Skin:    General: Skin is warm and dry.  Neurological:     Mental Status: He is alert.     ED Results / Procedures / Treatments   Labs (all labs ordered are listed, but only abnormal results are displayed) Labs Reviewed  COMPREHENSIVE METABOLIC PANEL - Abnormal; Notable for the following components:      Result Value   Glucose, Bld 107 (*)    Calcium 8.6 (*)    All other components within normal limits  CULTURE, BLOOD (ROUTINE X 2)  CULTURE, BLOOD (ROUTINE X 2)  LACTIC ACID, PLASMA  LACTIC ACID, PLASMA  CBC WITH DIFFERENTIAL/PLATELET  D-DIMER, QUANTITATIVE (NOT AT [facility])  PROCALCITONIN  LACTATE DEHYDROGENASE  FERRITIN  TRIGLYCERIDES  FIBRINOGEN  C-REACTIVE PROTEIN    EKG EKG Interpretation  Date/Time:  Tuesday September 23 2019 14:15:22 EDT Ventricular Rate:  77 PR Interval:    QRS Duration: 84 QT Interval:  338 QTC Calculation: 383 R Axis:   90 Text Interpretation: Sinus rhythm Borderline right axis deviation Baseline wander in lead(s) V5 V6 Confirmed by Bethann Berkshire [phone]) on 09/23/2019 3:09:25 PM   Radiology DG Chest Portable 1 View  Result Date: 09/23/2019 CLINICAL DATA:  Short of breath.  COVID positive EXAM: PORTABLE CHEST 1 VIEW COMPARISON:  12/29/2013 FINDINGS: Mild bibasilar airspace disease. Decreased lung volume. No focal effusion. Heart size and vascularity normal. IMPRESSION: Decreased lung volume with mild bibasilar airspace disease most likely atelectasis. Follow-up recommended to exclude developing pneumonia. Electronically Signed   By: Marlan Palau M.D.   On: 09/23/2019 14:15     Procedures Procedures (including critical care time)  Medications Ordered in ED Medications  remdesivir 100 mg in sodium chloride 0.9 % 100 mL IVPB (100 mg Intravenous New Bag/Given 09/23/19 1653)  remdesivir 100 mg in sodium chloride 0.9 % 100 mL IVPB (has no administration in time range)  dexamethasone (DECADRON) injection 6 mg (6 mg Intravenous Given 09/23/19 1425)  promethazine (PHENERGAN) injection 12.5 mg (12.5 mg Intravenous Given 09/23/19 1424)    ED Course  I have reviewed the triage vital signs and the nursing notes.  Pertinent labs & imaging results that were available during my care of the patient were reviewed by me and considered in my medical decision making (see chart for details).    MDM Rules/Calculators/A&P  Patient presents with documentation of COVID-19 positivity per email on his phone. Therefore he was not retested.  Vital signs have been stable here, no documented hypoxia both at home and in his MDs office this morning.  He was ambulating in the room here and desaturated to 88%.  He was given an IV dose of Decadron, remdesivir has also been ordered.  Placed on 2 L nasal cannula for comfort with breathing.  Patient will require admission, call placed to hospitalist.  Discussed with Dr. Mariea Clonts who accepts patient for admission.  Final Clinical Impression(s) / ED Diagnoses Final diagnoses:  COVID-19  Hypoxia    Rx / DC Orders ED Discharge Orders    None       Victoriano Lain 09/24/19 [id number]    Bethann Berkshire, MD 09/24/19 1248

## 2019-09-24 ENCOUNTER — Ambulatory Visit (HOSPITAL_COMMUNITY): Payer: Self-pay

## 2019-09-24 ENCOUNTER — Encounter (HOSPITAL_COMMUNITY): Payer: Self-pay | Admitting: Internal Medicine

## 2019-09-24 DIAGNOSIS — I10 Essential (primary) hypertension: Secondary | ICD-10-CM

## 2019-09-24 MED ORDER — PREDNISONE 50 MG PO TABS
50.0000 mg | ORAL_TABLET | Freq: Every day | ORAL | Status: DC
  Administered 2019-09-24: 50 mg via ORAL
  Filled 2019-09-24: qty 1

## 2019-09-24 MED ORDER — ALBUTEROL SULFATE HFA 108 (90 BASE) MCG/ACT IN AERS: 2.0000 | INHALATION_SPRAY | Freq: Four times a day (QID) | RESPIRATORY_TRACT | 2 refills | Status: DC | PRN

## 2019-09-24 MED ORDER — PREDNISONE 20 MG PO TABS: 40.0000 mg | ORAL_TABLET | Freq: Every day | ORAL | 0 refills | Status: DC

## 2019-09-24 MED ORDER — DM-GUAIFENESIN ER 30-600 MG PO TB12: 1.0000 | ORAL_TABLET | Freq: Two times a day (BID) | ORAL | 0 refills | Status: DC

## 2019-09-24 NOTE — ED Notes (Signed)
Nsg Discharge Note  Admit Date:  09/23/2019 Discharge date: 09/24/2019   [name] to be D/C'd home per MD order.  AVS completed.  Copy for chart, and copy for patient signed, and dated. Patient/caregiver able to verbalize understanding.  Discharge Medication: Allergies as of 09/24/2019      Reactions   Zithromax [azithromycin] Nausea And Vomiting   Lisinopril Cough      Medication List    STOP taking these medications   ibuprofen 200 MG tablet Commonly known as: ADVIL     TAKE these medications   albuterol 108 (90 Base) MCG/ACT inhaler Commonly known as: VENTOLIN HFA Inhale 2 puffs into the lungs every 6 (six) hours as needed for wheezing or shortness of breath.   amLODipine 5 MG tablet Commonly known as: NORVASC TAKE 1 TABLET(5 MG) BY MOUTH DAILY What changed:   how much to take  how to take this  when to take this  additional instructions   dextromethorphan-guaiFENesin 30-600 MG 12hr tablet Commonly known as: MUCINEX DM Take 1 tablet by mouth 2 (two) times daily.   meloxicam 15 MG tablet Commonly known as: MOBIC Take 1 tablet (15 mg total) by mouth daily.   ondansetron 8 MG tablet Commonly known as: Zofran Take 1 tablet (8 mg total) by mouth every 8 (eight) hours as needed for nausea or vomiting.   predniSONE 20 MG tablet Commonly known as: DELTASONE Take 2 tablets (40 mg total) by mouth daily.       Discharge Assessment: Vitals:   09/24/19 0915 09/24/19 0930  BP:    Pulse: 70 69  Resp: 17 11  Temp:    SpO2: 96% 95%   Skin clean, dry and intact without evidence of skin break down, no evidence of skin tears noted. IV catheter discontinued intact. Site without signs and symptoms of complications - no redness or edema noted at insertion site, patient denies c/o pain - only slight tenderness at site.  Dressing with slight pressure applied.  D/c Instructions-Education: Discharge instructions given to patient/family with verbalized  understanding. D/c education completed with patient/family including follow up instructions, medication list, d/c activities limitations if indicated, with other d/c instructions as indicated by MD - patient able to verbalize understanding, all questions fully answered. Patient instructed to return to ED, call 911, or call MD for any changes in condition.  Patient escorted via WC, and D/C home via private auto.  Rocco Pauls, RN 09/24/2019 1:05 PM

## 2019-09-24 NOTE — Progress Notes (Signed)
The patient is scheduled for a Remdesivir infusion on 9/16, 9/17, and 9/18 at 2 pm. Have the patient come to [address] [facility], they will see a COVID infusion banner by the road.  Enter there and turn left. There are marked spaces for Infusion.  Call the number on the sign or [phone] and someone will come out and bring them inside.  If someone is driving them, have them come to the same area and call the number and someone will come outside to get them. Thank you!

## 2019-09-24 NOTE — Discharge Instructions (Signed)
You are scheduled for a Remdesivir infusion on 9/16, 9/17, and 9/18 at 2pm. Please come to [address], you will see a COVID infusion banner by the road.  Enter there and turn left.  There are marked spaces for Infusion. Call the number on the sign or [phone] and someone will come out and bring you inside. If someone is driving you please come to the same area and call the number and someone will come outside to get you. Thank you!      You will need to quarantine until 10/14/2019 COVID-19 COVID-19 is a respiratory infection that is caused by a virus called severe acute respiratory syndrome coronavirus 2 (SARS-CoV-2). The disease is also known as coronavirus disease or novel coronavirus. In some people, the virus may not cause any symptoms. In others, it may cause a serious infection. The infection can get worse quickly and can lead to complications, such as:  Pneumonia, or infection of the lungs.  Acute respiratory distress syndrome or ARDS. This is a condition in which fluid build-up in the lungs prevents the lungs from filling with air and passing oxygen into the blood.  Acute respiratory failure. This is a condition in which there is not enough oxygen passing from the lungs to the body or when carbon dioxide is not passing from the lungs out of the body.  Sepsis or septic shock. This is a serious bodily reaction to an infection.  Blood clotting problems.  Secondary infections due to bacteria or fungus.  Organ failure. This is when your body's organs stop working. The virus that causes COVID-19 is contagious. This means that it can spread from person to person through droplets from coughs and sneezes (respiratory secretions). What are the causes? This illness is caused by a virus. You may catch the virus by:  Breathing in droplets from an infected person. Droplets can be spread by a person breathing, speaking, singing, coughing, or sneezing.  Touching something, like a table or a  doorknob, that was exposed to the virus (contaminated) and then touching your mouth, nose, or eyes. What increases the risk? Risk for infection You are more likely to be infected with this virus if you:  Are within 6 feet (2 meters) of a person with COVID-19.  Provide care for or live with a person who is infected with COVID-19.  Spend time in crowded indoor spaces or live in shared housing. Risk for serious illness You are more likely to become seriously ill from the virus if you:  Are 39 years of age or older. The higher your age, the more you are at risk for serious illness.  Live in a nursing home or long-term care facility.  Have cancer.  Have a long-term (chronic) disease such as: ? Chronic lung disease, including chronic obstructive pulmonary disease or asthma. ? A long-term disease that lowers your body's ability to fight infection (immunocompromised). ? Heart disease, including heart failure, a condition in which the arteries that lead to the heart become narrow or blocked (coronary artery disease), a disease which makes the heart muscle thick, weak, or stiff (cardiomyopathy). ? Diabetes. ? Chronic kidney disease. ? Sickle cell disease, a condition in which red blood cells have an abnormal "sickle" shape. ? Liver disease.  Are obese. What are the signs or symptoms? Symptoms of this condition can range from mild to severe. Symptoms may appear any time from 2 to 14 days after being exposed to the virus. They include:  A fever  or chills.  A cough.  Difficulty breathing.  Headaches, body aches, or muscle aches.  Runny or stuffy (congested) nose.  A sore throat.  New loss of taste or smell. Some people may also have stomach problems, such as nausea, vomiting, or diarrhea. Other people may not have any symptoms of COVID-19. How is this diagnosed? This condition may be diagnosed based on:  Your signs and symptoms, especially if: ? You live in an area with a  COVID-19 outbreak. ? You recently traveled to or from an area where the virus is common. ? You provide care for or live with a person who was diagnosed with COVID-19. ? You were exposed to a person who was diagnosed with COVID-19.  A physical exam.  Lab tests, which may include: ? Taking a sample of fluid from the back of your nose and throat (nasopharyngeal fluid), your nose, or your throat using a swab. ? A sample of mucus from your lungs (sputum). ? Blood tests.  Imaging tests, which may include, X-rays, CT scan, or ultrasound. How is this treated? At present, there is no medicine to treat COVID-19. Medicines that treat other diseases are being used on a trial basis to see if they are effective against COVID-19. Your health care provider will talk with you about ways to treat your symptoms. For most people, the infection is mild and can be managed at home with rest, fluids, and over-the-counter medicines. Treatment for a serious infection usually takes places in a hospital intensive care unit (ICU). It may include one or more of the following treatments. These treatments are given until your symptoms improve.  Receiving fluids and medicines through an IV.  Supplemental oxygen. Extra oxygen is given through a tube in the nose, a face mask, or a hood.  Positioning you to lie on your stomach (prone position). This makes it easier for oxygen to get into the lungs.  Continuous positive airway pressure (CPAP) or bi-level positive airway pressure (BPAP) machine. This treatment uses mild air pressure to keep the airways open. A tube that is connected to a motor delivers oxygen to the body.  Ventilator. This treatment moves air into and out of the lungs by using a tube that is placed in your windpipe.  Tracheostomy. This is a procedure to create a hole in the neck so that a breathing tube can be inserted.  Extracorporeal membrane oxygenation (ECMO). This procedure gives the lungs a chance to  recover by taking over the functions of the heart and lungs. It supplies oxygen to the body and removes carbon dioxide. Follow these instructions at home: Lifestyle  If you are sick, stay home except to get medical care. Your health care provider will tell you how long to stay home. Call your health care provider before you go for medical care.  Rest at home as told by your health care provider.  Do not use any products that contain nicotine or tobacco, such as cigarettes, e-cigarettes, and chewing tobacco. If you need help quitting, ask your health care provider.  Return to your normal activities as told by your health care provider. Ask your health care provider what activities are safe for you. General instructions  Take over-the-counter and prescription medicines only as told by your health care provider.  Drink enough fluid to keep your urine pale yellow.  Keep all follow-up visits as told by your health care provider. This is important. How is this prevented?  There is no vaccine to help prevent  COVID-19 infection. However, there are steps you can take to protect yourself and others from this virus. To protect yourself:   Do not travel to areas where COVID-19 is a risk. The areas where COVID-19 is reported change often. To identify high-risk areas and travel restrictions, check the CDC travel website: StageSync.si  If you live in, or must travel to, an area where COVID-19 is a risk, take precautions to avoid infection. ? Stay away from people who are sick. ? Wash your hands often with soap and water for 20 seconds. If soap and water are not available, use an alcohol-based hand sanitizer. ? Avoid touching your mouth, face, eyes, or nose. ? Avoid going out in public, follow guidance from your state and local health authorities. ? If you must go out in public, wear a cloth face covering or face mask. Make sure your mask covers your nose and mouth. ? Avoid crowded  indoor spaces. Stay at least 6 feet (2 meters) away from others. ? Disinfect objects and surfaces that are frequently touched every day. This may include:  Counters and tables.  Doorknobs and light switches.  Sinks and faucets.  Electronics, such as phones, remote controls, keyboards, computers, and tablets. To protect others: If you have symptoms of COVID-19, take steps to prevent the virus from spreading to others.  If you think you have a COVID-19 infection, contact your health care provider right away. Tell your health care team that you think you may have a COVID-19 infection.  Stay home. Leave your house only to seek medical care. Do not use public transport.  Do not travel while you are sick.  Wash your hands often with soap and water for 20 seconds. If soap and water are not available, use alcohol-based hand sanitizer.  Stay away from other members of your household. Let healthy household members care for children and pets, if possible. If you have to care for children or pets, wash your hands often and wear a mask. If possible, stay in your own room, separate from others. Use a different bathroom.  Make sure that all people in your household wash their hands well and often.  Cough or sneeze into a tissue or your sleeve or elbow. Do not cough or sneeze into your hand or into the air.  Wear a cloth face covering or face mask. Make sure your mask covers your nose and mouth. Where to find more information  Centers for Disease Control and Prevention: StickerEmporium.tn  World Health Organization: https://thompson-craig.com/ Contact a health care provider if:  You live in or have traveled to an area where COVID-19 is a risk and you have symptoms of the infection.  You have had contact with someone who has COVID-19 and you have symptoms of the infection. Get help right away if:  You have trouble breathing.  You have pain or pressure in  your chest.  You have confusion.  You have bluish lips and fingernails.  You have difficulty waking from sleep.  You have symptoms that get worse. These symptoms may represent a serious problem that is an emergency. Do not wait to see if the symptoms will go away. Get medical help right away. Call your local emergency services (911 in the [place] Do not drive yourself to the hospital. Let the emergency medical personnel know if you think you have COVID-19. Summary  COVID-19 is a respiratory infection that is caused by a virus. It is also known as coronavirus disease or novel coronavirus. It  can cause serious infections, such as pneumonia, acute respiratory distress syndrome, acute respiratory failure, or sepsis.  The virus that causes COVID-19 is contagious. This means that it can spread from person to person through droplets from breathing, speaking, singing, coughing, or sneezing.  You are more likely to develop a serious illness if you are 36 years of age or older, have a weak immune system, live in a nursing home, or have chronic disease.  There is no medicine to treat COVID-19. Your health care provider will talk with you about ways to treat your symptoms.  Take steps to protect yourself and others from infection. Wash your hands often and disinfect objects and surfaces that are frequently touched every day. Stay away from people who are sick and wear a mask if you are sick. This information is not intended to replace advice given to you by your health care provider. Make sure you discuss any questions you have with your health care provider. Document Revised: 10/25/2018 Document Reviewed: 01/31/2018 Elsevier Patient Education  2020 [employer].

## 2019-09-24 NOTE — Progress Notes (Signed)
Ambulated 100 ft, O2 remained 96% on room air with no desat less than 95% on room air. MD aware. No SOB or distress.

## 2019-09-24 NOTE — ED Notes (Signed)
Patient denies pain and is resting comfortably.  

## 2019-09-24 NOTE — Discharge Summary (Signed)
Physician Discharge Summary  [name]:[id number] DOB: 02/11/1980 DOA: 09/23/2019  PCP: Babs Sciara, MD  Admit date: 09/23/2019 Discharge date: 09/24/2019  Admitted From: Home Disposition: Home  Recommendations for Outpatient Follow-up:  1. Follow up with PCP in 1-2 weeks 2. Please obtain BMP/CBC in one week 3. Patient has been set up for outpatient remdesivir on 9/16, 9/17, 9/18 to complete his 5-day course 4. He has been advised to quarantine for a total of 21 days  Home Health: Equipment/Devices:  Discharge Condition: Stable CODE STATUS: Full code Diet recommendation: Regular diet  Brief/Interim Summary: Isaiah Hicks is a 39 y.o. male with medical history significant for hypertension, obesity, fatty liver, dyslipidemia. Patient presented to the ED with complaints of body aches, difficulty breathing and cough with fever.  Patient tested positive for Covid-outside facility in [place].  Patient saw his primary care provider today, O2 sats were 86% in the office, he was sent to the ED. patient was scheduled for outpatient antibody infusion tomorrow. He denies chest pain, no leg swelling.  Reports episode of dry heaving, but no actual vomiting, no loose stools.  ED Course: O2 sats 90 to 95% on room air, dropped to 88% with ambulation.  Improved with 2 L oxygen.  Normal lactic acid.  WBC 4.8.  Procalcitonin less than 0.1.  Inflammatory markers unremarkable.  Portable chest x-ray showing mild bibasilar airspace disease atelectasis, pneumonia was not excluded.  Remdesivir and dexamethasone started.  Hospitalist admit for Covid pneumonia with hypoxia.  Discharge Diagnoses:  Principal Problem:   Pneumonia due to COVID-19 virus Active Problems:   Essential hypertension  This patient was admitted to the hospital with acute respiratory failure secondary to COVID-19 pneumonia.  He was noted to have oxygen saturations down to 88% on room air while ambulating.  The patient  was started on remdesivir and intravenous steroids.  Clinically, the patient has improved.  Inflammatory markers are trending down.  The patient was ambulated the following day and did not have any significant hypoxia or shortness of breath.  He has been set up for outpatient remdesivir to complete his course.  He is also been put on a prednisone taper.  Further management will be supportive.  He is advised to quarantine for the next 21 days.  At this point, he appears to have improved to the point that he can continue management of his COVID-19 infection at home.  He has been advised to return to the hospital if he has any worsening shortness of breath or any new symptoms.  Discharge Instructions  Discharge Instructions    Diet - low sodium heart healthy   Complete by: As directed    Increase activity slowly   Complete by: As directed      Allergies as of 09/24/2019      Reactions   Zithromax [azithromycin] Nausea And Vomiting   Lisinopril Cough      Medication List    STOP taking these medications   ibuprofen 200 MG tablet Commonly known as: ADVIL     TAKE these medications   albuterol 108 (90 Base) MCG/ACT inhaler Commonly known as: VENTOLIN HFA Inhale 2 puffs into the lungs every 6 (six) hours as needed for wheezing or shortness of breath.   amLODipine 5 MG tablet Commonly known as: NORVASC TAKE 1 TABLET(5 MG) BY MOUTH DAILY What changed:   how much to take  how to take this  when to take this  additional instructions   dextromethorphan-guaiFENesin 30-600  MG 12hr tablet Commonly known as: MUCINEX DM Take 1 tablet by mouth 2 (two) times daily.   meloxicam 15 MG tablet Commonly known as: MOBIC Take 1 tablet (15 mg total) by mouth daily.   ondansetron 8 MG tablet Commonly known as: Zofran Take 1 tablet (8 mg total) by mouth every 8 (eight) hours as needed for nausea or vomiting.   predniSONE 20 MG tablet Commonly known as: DELTASONE Take 2 tablets (40 mg total)  by mouth daily.       Allergies  Allergen Reactions  . Zithromax [Azithromycin] Nausea And Vomiting  . Lisinopril Cough    Consultations:     Procedures/Studies: DG Chest Portable 1 View  Result Date: 09/23/2019 CLINICAL DATA:  Short of breath.  COVID positive EXAM: PORTABLE CHEST 1 VIEW COMPARISON:  12/29/2013 FINDINGS: Mild bibasilar airspace disease. Decreased lung volume. No focal effusion. Heart size and vascularity normal. IMPRESSION: Decreased lung volume with mild bibasilar airspace disease most likely atelectasis. Follow-up recommended to exclude developing pneumonia. Electronically Signed   By: Marlan Palauharles  Clark M.D.   On: 09/23/2019 14:15       Subjective: Shortness of breath is better.  He is not having any significant cough.  Able to ambulate.  Discharge Exam: Vitals:   09/24/19 0845 09/24/19 0900 09/24/19 0915 09/24/19 0930  BP:      Pulse: 70 70 70 69  Resp: 17 18 17 11   Temp:      TempSrc:      SpO2: 94% 98% 96% 95%  Weight:      Height:        General: Pt is alert, awake, not in acute distress Cardiovascular: RRR, S1/S2 +, no rubs, no gallops Respiratory: CTA bilaterally, no wheezing, no rhonchi Abdominal: Soft, NT, ND, bowel sounds + Extremities: no edema, no cyanosis    The results of significant diagnostics from this hospitalization (including imaging, microbiology, ancillary and laboratory) are listed below for reference.     Microbiology: Recent Results (from the past 240 hour(s))  Blood Culture (routine x 2)     Status: None (Preliminary result)   Collection Time: 09/23/19  2:15 PM   Specimen: Right Antecubital; Blood  Result Value Ref Range Status   Specimen Description RIGHT ANTECUBITAL  Final   Special Requests   Final    BOTTLES DRAWN AEROBIC AND ANAEROBIC Blood Culture adequate volume   Culture   Final    NO GROWTH < 24 HOURS Performed at [facility], [address] [place], [place] [address]    Report Status PENDING   Incomplete  Blood Culture (routine x 2)     Status: None (Preliminary result)   Collection Time: 09/23/19  2:28 PM   Specimen: Left Antecubital; Blood  Result Value Ref Range Status   Specimen Description LEFT ANTECUBITAL  Final   Special Requests   Final    BOTTLES DRAWN AEROBIC AND ANAEROBIC Blood Culture adequate volume Performed at [facility], [address] [place], [place] [address]    Culture PENDING  Incomplete   Report Status PENDING  Incomplete  SARS Coronavirus 2 by RT PCR (hospital order, performed [facility]) Nasopharyngeal Nasopharyngeal Swab     Status: Abnormal   Collection Time: 09/23/19  8:06 PM   Specimen: Nasopharyngeal Swab  Result Value Ref Range Status   SARS Coronavirus 2 POSITIVE (A) NEGATIVE Final    Comment: CRITICAL RESULT CALLED TO, READ BACK BY AND VERIFIED WITH: TONEY,RN AT 2134 ON 9.14.21 BY  ISLEY,B (NOTE) SARS-CoV-2 target nucleic acids are DETECTED  SARS-CoV-2 RNA is generally detectable in upper respiratory specimens  during the acute phase of infection.  Positive results are indicative  of the presence of the identified virus, but do not rule out bacterial infection or co-infection with other pathogens not detected by the test.  Clinical correlation with patient history and  other diagnostic information is necessary to determine patient infection status.  The expected result is negative.  Fact Sheet for Patients:   BoilerBrush.com.cy   Fact Sheet for Healthcare Providers:   https://pope.com/    This test is not yet approved or cleared by the [place] FDA and  has been authorized for detection and/or diagnosis of SARS-CoV-2 by FDA under an Emergency Use Authorization (EUA).  This EUA will remain in effect (me aning this test can be used) for the duration of  the COVID-19 declaration under Section 564(b)(1) of the Act, 21 U.S.C. section 360-bbb-3(b)(1), unless the  authorization is terminated or revoked sooner.  Performed at [facility], [address] [place], [place] [address]      Labs: BNP (last 3 results) No results for input(s): BNP in the last 8760 hours. Basic Metabolic Panel: Recent Labs  Lab 09/23/19 1415  NA 135  K 3.6  CL 103  CO2 24  GLUCOSE 107*  BUN 16  CREATININE 1.04  CALCIUM 8.6*   Liver Function Tests: Recent Labs  Lab 09/23/19 1415  AST 36  ALT 38  ALKPHOS 62  BILITOT 0.7  PROT 7.0  ALBUMIN 4.3   No results for input(s): LIPASE, AMYLASE in the last 168 hours. No results for input(s): AMMONIA in the last 168 hours. CBC: Recent Labs  Lab 09/23/19 1415  WBC 4.8  NEUTROABS 3.5  HGB 14.3  HCT 44.0  MCV 81.8  PLT 159   Cardiac Enzymes: No results for input(s): CKTOTAL, CKMB, CKMBINDEX, TROPONINI in the last 168 hours. BNP: Invalid input(s): POCBNP CBG: No results for input(s): GLUCAP in the last 168 hours. D-Dimer Recent Labs    09/23/19 1415  DDIMER 0.34   Hgb A1c No results for input(s): HGBA1C in the last 72 hours. Lipid Profile Recent Labs    09/23/19 1419  TRIG 66   Thyroid function studies No results for input(s): TSH, T4TOTAL, T3FREE, THYROIDAB in the last 72 hours.  Invalid input(s): FREET3 Anemia work up Recent Labs    09/23/19 1419  FERRITIN 153   Urinalysis    Component Value Date/Time   COLORURINE YELLOW 08/08/2010 0720   APPEARANCEUR CLEAR 08/08/2010 0720   LABSPEC 1.022 08/08/2010 0720   PHURINE 6.5 08/08/2010 0720   GLUCOSEU NEGATIVE 08/08/2010 0720   HGBUR NEGATIVE 08/08/2010 0720   BILIRUBINUR NEGATIVE 08/08/2010 0720   KETONESUR NEGATIVE 08/08/2010 0720   PROTEINUR NEGATIVE 08/08/2010 0720   UROBILINOGEN 1.0 08/08/2010 0720   NITRITE NEGATIVE 08/08/2010 0720   LEUKOCYTESUR NEGATIVE 08/08/2010 0720   Sepsis Labs Invalid input(s): PROCALCITONIN,  WBC,  LACTICIDVEN Microbiology Recent Results (from the past 240 hour(s))  Blood Culture (routine x 2)      Status: None (Preliminary result)   Collection Time: 09/23/19  2:15 PM   Specimen: Right Antecubital; Blood  Result Value Ref Range Status   Specimen Description RIGHT ANTECUBITAL  Final   Special Requests   Final    BOTTLES DRAWN AEROBIC AND ANAEROBIC Blood Culture adequate volume   Culture   Final    NO GROWTH < 24 HOURS Performed at [facility],  [address], [place], [place] [address]    Report Status PENDING  Incomplete  Blood Culture (routine x 2)     Status: None (Preliminary result)   Collection Time: 09/23/19  2:28 PM   Specimen: Left Antecubital; Blood  Result Value Ref Range Status   Specimen Description LEFT ANTECUBITAL  Final   Special Requests   Final    BOTTLES DRAWN AEROBIC AND ANAEROBIC Blood Culture adequate volume Performed at [facility], [address], [place], [place] [address]    Culture PENDING  Incomplete   Report Status PENDING  Incomplete  SARS Coronavirus 2 by RT PCR (hospital order, performed in [facility] hospital lab) Nasopharyngeal Nasopharyngeal Swab     Status: Abnormal   Collection Time: 09/23/19  8:06 PM   Specimen: Nasopharyngeal Swab  Result Value Ref Range Status   SARS Coronavirus 2 POSITIVE (A) NEGATIVE Final    Comment: CRITICAL RESULT CALLED TO, READ BACK BY AND VERIFIED WITH: TONEY,RN AT 2134 ON 9.14.21 BY ISLEY,B (NOTE) SARS-CoV-2 target nucleic acids are DETECTED  SARS-CoV-2 RNA is generally detectable in upper respiratory specimens  during the acute phase of infection.  Positive results are indicative  of the presence of the identified virus, but do not rule out bacterial infection or co-infection with other pathogens not detected by the test.  Clinical correlation with patient history and  other diagnostic information is necessary to determine patient infection status.  The expected result is negative.  Fact Sheet for Patients:   BoilerBrush.com.cy   Fact Sheet for Healthcare Providers:    https://pope.com/    This test is not yet approved or cleared by the [place] FDA and  has been authorized for detection and/or diagnosis of SARS-CoV-2 by FDA under an Emergency Use Authorization (EUA).  This EUA will remain in effect (me aning this test can be used) for the duration of  the COVID-19 declaration under Section 564(b)(1) of the Act, 21 U.S.C. section 360-bbb-3(b)(1), unless the authorization is terminated or revoked sooner.  Performed at [facility], [address], [place], [place] [address]      Time coordinating discharge:  SIGNED:   Erick Blinks, MD  [facility] 09/24/2019, 8:46 PM   If 7PM-7AM, please contact night-coverage [email or web address]

## 2019-09-25 ENCOUNTER — Ambulatory Visit (HOSPITAL_COMMUNITY)
Admit: 2019-09-25 | Discharge: 2019-09-25 | Disposition: A | Discharge status: Home / Self Care | Payer: 59 | Source: Ambulatory Visit | Attending: Pulmonary Disease | Admitting: Pulmonary Disease

## 2019-09-25 DIAGNOSIS — J1282 Pneumonia due to coronavirus disease 2019: Secondary | ICD-10-CM | POA: Insufficient documentation

## 2019-09-25 DIAGNOSIS — U071 COVID-19: Secondary | ICD-10-CM | POA: Insufficient documentation

## 2019-09-25 MED ORDER — METHYLPREDNISOLONE SODIUM SUCC 125 MG IJ SOLR: 125.0000 mg | Freq: Once | INTRAMUSCULAR | Status: DC | PRN

## 2019-09-25 MED ORDER — EPINEPHRINE 0.3 MG/0.3ML IJ SOAJ: 0.3000 mg | Freq: Once | INTRAMUSCULAR | Status: DC | PRN

## 2019-09-25 MED ORDER — DIPHENHYDRAMINE HCL 50 MG/ML IJ SOLN: 50.0000 mg | Freq: Once | INTRAMUSCULAR | Status: DC | PRN

## 2019-09-25 MED ORDER — SODIUM CHLORIDE 0.9 % IV SOLN
100.0000 mg | Freq: Once | INTRAVENOUS | Status: AC
  Administered 2019-09-25: 100 mg via INTRAVENOUS
  Filled 2019-09-25: qty 20

## 2019-09-25 MED ORDER — ALBUTEROL SULFATE HFA 108 (90 BASE) MCG/ACT IN AERS: 2.0000 | INHALATION_SPRAY | Freq: Once | RESPIRATORY_TRACT | Status: DC | PRN

## 2019-09-25 MED ORDER — FAMOTIDINE IN NACL 20-0.9 MG/50ML-% IV SOLN: 20.0000 mg | Freq: Once | INTRAVENOUS | Status: DC | PRN

## 2019-09-25 MED ORDER — SODIUM CHLORIDE 0.9 % IV SOLN: INTRAVENOUS | Status: DC | PRN

## 2019-09-25 NOTE — Discharge Instructions (Signed)
10 Things You Can Do to Manage Your COVID-19 Symptoms at Home If you have possible or confirmed COVID-19: 1. Stay home from work and school. And stay away from other public places. If you must go out, avoid using any kind of public transportation, ridesharing, or taxis. 2. Monitor your symptoms carefully. If your symptoms get worse, call your healthcare provider immediately. 3. Get rest and stay hydrated. 4. If you have a medical appointment, call the healthcare provider ahead of time and tell them that you have or may have COVID-19. 5. For medical emergencies, call 911 and notify the dispatch personnel that you have or may have COVID-19. 6. Cover your cough and sneezes with a tissue or use the inside of your elbow. 7. Wash your hands often with soap and water for at least 20 seconds or clean your hands with an alcohol-based hand sanitizer that contains at least 60% alcohol. 8. As much as possible, stay in a specific room and away from other people in your home. Also, you should use a separate bathroom, if available. If you need to be around other people in or outside of the home, wear a mask. 9. Avoid sharing personal items with other people in your household, like dishes, towels, and bedding. 10. Clean all surfaces that are touched often, like counters, tabletops, and doorknobs. Use household cleaning sprays or wipes according to the label instructions. cdc.gov/coronavirus 07/10/2018 This information is not intended to replace advice given to you by your health care provider. Make sure you discuss any questions you have with your health care provider. Document Revised: 12/12/2018 Document Reviewed: 12/12/2018 Elsevier Patient Education  2020 Elsevier Inc.  

## 2019-09-25 NOTE — Progress Notes (Signed)
  Diagnosis: COVID-19  Physician: Dr. Delford Field  Procedure: [facility]: remdesivir infusion - Provided patient with remdesivir fact sheet for patients, parents and caregivers prior to infusion.  Complications: No immediate complications noted.  Discharge: Discharged home   Isaiah Hicks 09/25/2019

## 2019-09-26 ENCOUNTER — Ambulatory Visit (HOSPITAL_COMMUNITY)
Admit: 2019-09-26 | Discharge: 2019-09-26 | Disposition: A | Discharge status: Home / Self Care | Payer: 59 | Attending: Pulmonary Disease | Admitting: Pulmonary Disease

## 2019-09-26 DIAGNOSIS — U071 COVID-19: Secondary | ICD-10-CM | POA: Diagnosis not present

## 2019-09-26 MED ORDER — SODIUM CHLORIDE 0.9 % IV SOLN: INTRAVENOUS | Status: DC | PRN

## 2019-09-26 MED ORDER — FAMOTIDINE IN NACL 20-0.9 MG/50ML-% IV SOLN: 20.0000 mg | Freq: Once | INTRAVENOUS | Status: DC | PRN

## 2019-09-26 MED ORDER — EPINEPHRINE 0.3 MG/0.3ML IJ SOAJ: 0.3000 mg | Freq: Once | INTRAMUSCULAR | Status: DC | PRN

## 2019-09-26 MED ORDER — ALBUTEROL SULFATE HFA 108 (90 BASE) MCG/ACT IN AERS: 2.0000 | INHALATION_SPRAY | Freq: Once | RESPIRATORY_TRACT | Status: DC | PRN

## 2019-09-26 MED ORDER — SODIUM CHLORIDE 0.9 % IV SOLN
100.0000 mg | Freq: Once | INTRAVENOUS | Status: AC
  Administered 2019-09-26: 100 mg via INTRAVENOUS
  Filled 2019-09-26: qty 20

## 2019-09-26 MED ORDER — METHYLPREDNISOLONE SODIUM SUCC 125 MG IJ SOLR: 125.0000 mg | Freq: Once | INTRAMUSCULAR | Status: DC | PRN

## 2019-09-26 MED ORDER — DIPHENHYDRAMINE HCL 50 MG/ML IJ SOLN: 50.0000 mg | Freq: Once | INTRAMUSCULAR | Status: DC | PRN

## 2019-09-26 NOTE — Discharge Instructions (Signed)
10 Things You Can Do to Manage Your COVID-19 Symptoms at Home If you have possible or confirmed COVID-19: 1. Stay home from work and school. And stay away from other public places. If you must go out, avoid using any kind of public transportation, ridesharing, or taxis. 2. Monitor your symptoms carefully. If your symptoms get worse, call your healthcare provider immediately. 3. Get rest and stay hydrated. 4. If you have a medical appointment, call the healthcare provider ahead of time and tell them that you have or may have COVID-19. 5. For medical emergencies, call 911 and notify the dispatch personnel that you have or may have COVID-19. 6. Cover your cough and sneezes with a tissue or use the inside of your elbow. 7. Wash your hands often with soap and water for at least 20 seconds or clean your hands with an alcohol-based hand sanitizer that contains at least 60% alcohol. 8. As much as possible, stay in a specific room and away from other people in your home. Also, you should use a separate bathroom, if available. If you need to be around other people in or outside of the home, wear a mask. 9. Avoid sharing personal items with other people in your household, like dishes, towels, and bedding. 10. Clean all surfaces that are touched often, like counters, tabletops, and doorknobs. Use household cleaning sprays or wipes according to the label instructions. cdc.gov/coronavirus 07/10/2018 This information is not intended to replace advice given to you by your health care provider. Make sure you discuss any questions you have with your health care provider. Document Revised: 12/12/2018 Document Reviewed: 12/12/2018 Elsevier Patient Education  2020 Elsevier Inc.  

## 2019-09-26 NOTE — Progress Notes (Signed)
°  Diagnosis: COVID-19  Physician: Dr  Delford Field  Procedure: [facility]: remdesivir infusion - Provided patient with remdesivir fact sheet for patients, parents and caregivers prior to infusion.  Complications: No immediate complications noted.  Discharge: Discharged home   Isaiah Hicks 09/26/2019

## 2019-09-27 ENCOUNTER — Ambulatory Visit (HOSPITAL_COMMUNITY)
Admit: 2019-09-27 | Discharge: 2019-09-27 | Disposition: A | Discharge status: Home / Self Care | Payer: 59 | Attending: Pulmonary Disease | Admitting: Pulmonary Disease

## 2019-09-27 DIAGNOSIS — U071 COVID-19: Secondary | ICD-10-CM | POA: Diagnosis not present

## 2019-09-27 MED ORDER — FAMOTIDINE IN NACL 20-0.9 MG/50ML-% IV SOLN: 20.0000 mg | Freq: Once | INTRAVENOUS | Status: DC | PRN

## 2019-09-27 MED ORDER — ALBUTEROL SULFATE HFA 108 (90 BASE) MCG/ACT IN AERS: 2.0000 | INHALATION_SPRAY | Freq: Once | RESPIRATORY_TRACT | Status: DC | PRN

## 2019-09-27 MED ORDER — EPINEPHRINE 0.3 MG/0.3ML IJ SOAJ: 0.3000 mg | Freq: Once | INTRAMUSCULAR | Status: DC | PRN

## 2019-09-27 MED ORDER — SODIUM CHLORIDE 0.9 % IV SOLN
100.0000 mg | Freq: Once | INTRAVENOUS | Status: AC
  Administered 2019-09-27: 100 mg via INTRAVENOUS
  Filled 2019-09-27: qty 20

## 2019-09-27 MED ORDER — SODIUM CHLORIDE 0.9 % IV SOLN: INTRAVENOUS | Status: DC | PRN

## 2019-09-27 MED ORDER — METHYLPREDNISOLONE SODIUM SUCC 125 MG IJ SOLR: 125.0000 mg | Freq: Once | INTRAMUSCULAR | Status: DC | PRN

## 2019-09-27 MED ORDER — DIPHENHYDRAMINE HCL 50 MG/ML IJ SOLN: 50.0000 mg | Freq: Once | INTRAMUSCULAR | Status: DC | PRN

## 2019-09-27 NOTE — Discharge Instructions (Signed)
10 Things You Can Do to Manage Your COVID-19 Symptoms at Home If you have possible or confirmed COVID-19: 1. Stay home from work and school. And stay away from other public places. If you must go out, avoid using any kind of public transportation, ridesharing, or taxis. 2. Monitor your symptoms carefully. If your symptoms get worse, call your healthcare provider immediately. 3. Get rest and stay hydrated. 4. If you have a medical appointment, call the healthcare provider ahead of time and tell them that you have or may have COVID-19. 5. For medical emergencies, call 911 and notify the dispatch personnel that you have or may have COVID-19. 6. Cover your cough and sneezes with a tissue or use the inside of your elbow. 7. Wash your hands often with soap and water for at least 20 seconds or clean your hands with an alcohol-based hand sanitizer that contains at least 60% alcohol. 8. As much as possible, stay in a specific room and away from other people in your home. Also, you should use a separate bathroom, if available. If you need to be around other people in or outside of the home, wear a mask. 9. Avoid sharing personal items with other people in your household, like dishes, towels, and bedding. 10. Clean all surfaces that are touched often, like counters, tabletops, and doorknobs. Use household cleaning sprays or wipes according to the label instructions. cdc.gov/coronavirus 07/10/2018 This information is not intended to replace advice given to you by your health care provider. Make sure you discuss any questions you have with your health care provider. Document Revised: 12/12/2018 Document Reviewed: 12/12/2018 Elsevier Patient Education  2020 Elsevier Inc.  

## 2019-09-27 NOTE — Progress Notes (Signed)
  Diagnosis: COVID-19  Physician: Patrick Wright, MD  Procedure: [facility] Med: remdesivir infusion - Provided patient with remdesivir fact sheet for patients, parents and caregivers prior to infusion.  Complications: No immediate complications noted.  Discharge: Discharged home   Isaiah Hicks 09/27/2019  

## 2019-09-28 LAB — CULTURE, BLOOD (ROUTINE X 2)
Culture: NO GROWTH
Special Requests: ADEQUATE

## 2019-10-06 LAB — CULTURE, BLOOD (ROUTINE X 2)
Culture: NO GROWTH
Special Requests: ADEQUATE

## 2019-11-27 ENCOUNTER — Telehealth: Payer: Self-pay | Admitting: Family Medicine

## 2019-11-27 MED ORDER — PROMETHAZINE HCL 25 MG PO TABS: ORAL_TABLET | ORAL | 0 refills | Status: DC

## 2019-11-27 NOTE — Telephone Encounter (Signed)
Pt requesting Phenergan be sent in due to having stomach bug for a couple of days. Pt states he has Zofran but that is not helping much. Please advise. Thank you

## 2019-11-27 NOTE — Telephone Encounter (Signed)
Phenergan sent to pharmacy and pt is aware

## 2019-11-27 NOTE — Telephone Encounter (Signed)
Phenergan 25 mg 1 twice daily as needed, #20, caution drowsiness

## 2019-12-22 ENCOUNTER — Other Ambulatory Visit: Payer: Self-pay

## 2019-12-22 ENCOUNTER — Ambulatory Visit (INDEPENDENT_AMBULATORY_CARE_PROVIDER_SITE_OTHER): Payer: 59 | Admitting: Family Medicine

## 2019-12-22 ENCOUNTER — Encounter: Payer: Self-pay | Admitting: Family Medicine

## 2019-12-22 VITALS — BP 128/82 | HR 84 | Temp 97.0°F | Ht 72.0 in | Wt 253.8 lb

## 2019-12-22 DIAGNOSIS — Z0000 Encounter for general adult medical examination without abnormal findings: Secondary | ICD-10-CM | POA: Diagnosis not present

## 2019-12-22 DIAGNOSIS — Z111 Encounter for screening for respiratory tuberculosis: Secondary | ICD-10-CM | POA: Diagnosis not present

## 2019-12-22 NOTE — Progress Notes (Signed)
Patient ID: Isaiah Hicks, male    DOB: 1980/03/29, 39 y.o.   MRN: [id number]   Chief Complaint  Patient presents with  . Annual Exam    Needs forms filled out for foster parenting   Subjective:  CC: physical   Presents today for annual wellness.  Does not have any health concerns today, he is a foster parent, and has required paperwork from the foster agency.  Was hospitalized in September with Covid infection, and was extremely sick at that time.  He has recovered.  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    Medical History Isaiah Hicks has a past medical history of Degenerative disc disease and Hypertension.   Outpatient Encounter Medications as of 12/22/2019  Medication Sig  . albuterol (VENTOLIN HFA) 108 (90 Base) MCG/ACT inhaler Inhale 2 puffs into the lungs every 6 (six) hours as needed for wheezing or shortness of breath. (Patient not taking: Reported on 12/22/2019)  . amLODipine (NORVASC) 5 MG tablet TAKE 1 TABLET(5 MG) BY MOUTH DAILY (Patient taking differently: Take 5 mg by mouth daily. )  . dextromethorphan-guaiFENesin (MUCINEX DM) 30-600 MG 12hr tablet Take 1 tablet by mouth 2 (two) times daily. (Patient not taking: Reported on 12/22/2019)  . meloxicam (MOBIC) 15 MG tablet Take 1 tablet (15 mg total) by mouth daily.  . ondansetron (ZOFRAN) 8 MG tablet Take 1 tablet (8 mg total) by mouth every 8 (eight) hours as needed for nausea or vomiting. (Patient not taking: Reported on 12/22/2019)  . predniSONE (DELTASONE) 20 MG tablet Take 2 tablets (40 mg total) by mouth daily. (Patient not taking: Reported on 12/22/2019)  . promethazine (PHENERGAN) 25 MG tablet Take one tablet po BID prn. CAUTION:DROWSINESS (Patient not taking: Reported on 12/22/2019)   No facility-administered encounter medications on file  as of 12/22/2019.     Review of Systems  Constitutional: Negative for chills and fever.  HENT: Negative for ear pain.   Respiratory: Negative for shortness of breath.   Cardiovascular: Negative for chest pain.  Gastrointestinal: Negative for abdominal pain.     Vitals BP 128/82   Pulse 84   Temp (!) 97 F (36.1 C) (Oral)   Ht 6' (1.829 m)   Wt 253 lb 12.8 oz (115.1 kg)   SpO2 99%   BMI 34.42 kg/m   Objective:   Physical Exam Vitals reviewed.  HENT:     Right Ear: Tympanic membrane normal.     Left Ear: Tympanic membrane normal.     Nose: Nose normal.     Mouth/Throat:     Mouth: Mucous membranes are moist.     Pharynx: Oropharynx is clear.  Cardiovascular:     Rate and Rhythm: Normal rate and regular rhythm.     Heart sounds: Normal heart sounds.  Pulmonary:     Effort: Pulmonary effort is normal.     Breath sounds: Normal breath sounds.  Abdominal:     General: Bowel sounds are normal.  Musculoskeletal:        General: Normal range of motion.  Skin:    General: Skin is warm and dry.  Neurological:     General: No focal deficit present.     Mental Status: He is alert.  Psychiatric:        Behavior:  Behavior normal.      Assessment and Plan   1. Screening for tuberculosis - QuantiFERON-TB Gold Plus  2. Wellness examination   Hypertension: Blood pressure today well controlled 122/82 reviewed labs kidney and liver functions within normal limits discussed lifestyle modification.  Encourage sodium restriction and exercise.  Medications refilled in August, no refills needed today.  He will get the QuantiFERON-TB Gold today to satisfy the requirement for TB skin test for the foster care agency paperwork.  Agrees with plan of care discussed today. Understands warning signs to seek further care: Chest pain, shortness of breath, any significant change in health. Understands to follow-up in February for medication management for hypertension.  He will call 1  week prior to that visit to see if any lab work is needed.  We will notify him once the QuantiFERON TB results are available, paperwork will be completed at that time, and he can pick up at the front desk.  Dorena Bodo, FNP-C

## 2019-12-22 NOTE — Patient Instructions (Signed)
DASH Eating Plan DASH stands for "Dietary Approaches to Stop Hypertension." The DASH eating plan is a healthy eating plan that has been shown to reduce high blood pressure (hypertension). It may also reduce your risk for type 2 diabetes, heart disease, and stroke. The DASH eating plan may also help with weight loss. What are tips for following this plan?  General guidelines  Avoid eating more than 2,300 mg (milligrams) of salt (sodium) a day. If you have hypertension, you may need to reduce your sodium intake to 1,500 mg a day.  Limit alcohol intake to no more than 1 drink a day for nonpregnant women and 2 drinks a day for men. One drink equals 12 oz of beer, 5 oz of wine, or 1 oz of hard liquor.  Work with your health care provider to maintain a healthy body weight or to lose weight. Ask what an ideal weight is for you.  Get at least 30 minutes of exercise that causes your heart to beat faster (aerobic exercise) most days of the week. Activities may include walking, swimming, or biking.  Work with your health care provider or diet and nutrition specialist (dietitian) to adjust your eating plan to your individual calorie needs. Reading food labels   Check food labels for the amount of sodium per serving. Choose foods with less than 5 percent of the Daily Value of sodium. Generally, foods with less than 300 mg of sodium per serving fit into this eating plan.  To find whole grains, look for the word "whole" as the first word in the ingredient list. Shopping  Buy products labeled as "low-sodium" or "no salt added."  Buy fresh foods. Avoid canned foods and premade or frozen meals. Cooking  Avoid adding salt when cooking. Use salt-free seasonings or herbs instead of table salt or sea salt. Check with your health care provider or pharmacist before using salt substitutes.  Do not fry foods. Cook foods using healthy methods such as baking, boiling, grilling, and broiling instead.  Cook with  heart-healthy oils, such as olive, canola, soybean, or sunflower oil. Meal planning  Eat a balanced diet that includes: ? 5 or more servings of fruits and vegetables each day. At each meal, try to fill half of your plate with fruits and vegetables. ? Up to 6-8 servings of whole grains each day. ? Less than 6 oz of lean meat, poultry, or fish each day. A 3-oz serving of meat is about the same size as a deck of cards. One egg equals 1 oz. ? 2 servings of low-fat dairy each day. ? A serving of nuts, seeds, or beans 5 times each week. ? Heart-healthy fats. Healthy fats called Omega-3 fatty acids are found in foods such as flaxseeds and coldwater fish, like sardines, salmon, and mackerel.  Limit how much you eat of the following: ? Canned or prepackaged foods. ? Food that is high in trans fat, such as fried foods. ? Food that is high in saturated fat, such as fatty meat. ? Sweets, desserts, sugary drinks, and other foods with added sugar. ? Full-fat dairy products.  Do not salt foods before eating.  Try to eat at least 2 vegetarian meals each week.  Eat more home-cooked food and less restaurant, buffet, and fast food.  When eating at a restaurant, ask that your food be prepared with less salt or no salt, if possible. What foods are recommended? The items listed may not be a complete list. Talk with your dietitian about   what dietary choices are best for you. Grains Whole-grain or whole-wheat bread. Whole-grain or whole-wheat pasta. Brown rice. Oatmeal. Quinoa. Bulgur. Whole-grain and low-sodium cereals. Pita bread. Low-fat, low-sodium crackers. Whole-wheat flour tortillas. Vegetables Fresh or frozen vegetables (raw, steamed, roasted, or grilled). Low-sodium or reduced-sodium tomato and vegetable juice. Low-sodium or reduced-sodium tomato sauce and tomato paste. Low-sodium or reduced-sodium canned vegetables. Fruits All fresh, dried, or frozen fruit. Canned fruit in natural juice (without  added sugar). Meat and other protein foods Skinless chicken or turkey. Ground chicken or turkey. Pork with fat trimmed off. Fish and seafood. Egg whites. Dried beans, peas, or lentils. Unsalted nuts, nut butters, and seeds. Unsalted canned beans. Lean cuts of beef with fat trimmed off. Low-sodium, lean deli meat. Dairy Low-fat (1%) or fat-free (skim) milk. Fat-free, low-fat, or reduced-fat cheeses. Nonfat, low-sodium ricotta or cottage cheese. Low-fat or nonfat yogurt. Low-fat, low-sodium cheese. Fats and oils Soft margarine without trans fats. Vegetable oil. Low-fat, reduced-fat, or light mayonnaise and salad dressings (reduced-sodium). Canola, safflower, olive, soybean, and sunflower oils. Avocado. Seasoning and other foods Herbs. Spices. Seasoning mixes without salt. Unsalted popcorn and pretzels. Fat-free sweets. What foods are not recommended? The items listed may not be a complete list. Talk with your dietitian about what dietary choices are best for you. Grains Baked goods made with fat, such as croissants, muffins, or some breads. Dry pasta or rice meal packs. Vegetables Creamed or fried vegetables. Vegetables in a cheese sauce. Regular canned vegetables (not low-sodium or reduced-sodium). Regular canned tomato sauce and paste (not low-sodium or reduced-sodium). Regular tomato and vegetable juice (not low-sodium or reduced-sodium). Pickles. Olives. Fruits Canned fruit in a light or heavy syrup. Fried fruit. Fruit in cream or butter sauce. Meat and other protein foods Fatty cuts of meat. Ribs. Fried meat. Bacon. Sausage. Bologna and other processed lunch meats. Salami. Fatback. Hotdogs. Bratwurst. Salted nuts and seeds. Canned beans with added salt. Canned or smoked fish. Whole eggs or egg yolks. Chicken or turkey with skin. Dairy Whole or 2% milk, cream, and half-and-half. Whole or full-fat cream cheese. Whole-fat or sweetened yogurt. Full-fat cheese. Nondairy creamers. Whipped toppings.  Processed cheese and cheese spreads. Fats and oils Butter. Stick margarine. Lard. Shortening. Ghee. Bacon fat. Tropical oils, such as coconut, palm kernel, or palm oil. Seasoning and other foods Salted popcorn and pretzels. Onion salt, garlic salt, seasoned salt, table salt, and sea salt. Worcestershire sauce. Tartar sauce. Barbecue sauce. Teriyaki sauce. Soy sauce, including reduced-sodium. Steak sauce. Canned and packaged gravies. Fish sauce. Oyster sauce. Cocktail sauce. Horseradish that you find on the shelf. Ketchup. Mustard. Meat flavorings and tenderizers. Bouillon cubes. Hot sauce and Tabasco sauce. Premade or packaged marinades. Premade or packaged taco seasonings. Relishes. Regular salad dressings. Where to find more information:  National Heart, Lung, and Blood Institute: www.nhlbi.nih.gov  American Heart Association: www.heart.org Summary  The DASH eating plan is a healthy eating plan that has been shown to reduce high blood pressure (hypertension). It may also reduce your risk for type 2 diabetes, heart disease, and stroke.  With the DASH eating plan, you should limit salt (sodium) intake to 2,300 mg a day. If you have hypertension, you may need to reduce your sodium intake to 1,500 mg a day.  When on the DASH eating plan, aim to eat more fresh fruits and vegetables, whole grains, lean proteins, low-fat dairy, and heart-healthy fats.  Work with your health care provider or diet and nutrition specialist (dietitian) to adjust your eating plan to your   individual calorie needs. This information is not intended to replace advice given to you by your health care provider. Make sure you discuss any questions you have with your health care provider. Document Revised: 12/08/2016 Document Reviewed: 12/20/2015 Elsevier Patient Education  2020 [employer].  

## 2019-12-25 ENCOUNTER — Encounter: Payer: Self-pay | Admitting: *Deleted

## 2019-12-25 LAB — QUANTIFERON-TB GOLD PLUS
QuantiFERON Mitogen Value: >10 IU/mL
QuantiFERON Nil Value: 0.08 IU/mL
QuantiFERON TB1 Ag Value: 0.05 IU/mL
QuantiFERON TB2 Ag Value: 0.06 IU/mL
QuantiFERON-TB Gold Plus: NEGATIVE

## 2020-02-25 ENCOUNTER — Other Ambulatory Visit: Payer: Self-pay | Admitting: Family Medicine

## 2020-02-27 ENCOUNTER — Encounter: Payer: Self-pay | Admitting: Family Medicine

## 2020-02-27 ENCOUNTER — Ambulatory Visit: Payer: 59 | Admitting: Family Medicine

## 2020-04-25 ENCOUNTER — Other Ambulatory Visit: Payer: Self-pay | Admitting: Family Medicine

## 2020-06-24 ENCOUNTER — Other Ambulatory Visit: Payer: Self-pay | Admitting: *Deleted

## 2020-06-25 MED ORDER — MELOXICAM 15 MG PO TABS
15.0000 mg | ORAL_TABLET | Freq: Every day | ORAL | 6 refills | Status: DC
Start: 2020-06-25 — End: 2020-08-25

## 2020-07-14 ENCOUNTER — Ambulatory Visit (INDEPENDENT_AMBULATORY_CARE_PROVIDER_SITE_OTHER): Payer: 59

## 2020-07-14 ENCOUNTER — Encounter: Payer: Self-pay | Admitting: Podiatry

## 2020-07-14 ENCOUNTER — Ambulatory Visit: Payer: 59 | Admitting: Podiatry

## 2020-07-14 ENCOUNTER — Other Ambulatory Visit: Payer: Self-pay

## 2020-07-14 DIAGNOSIS — M21862 Other specified acquired deformities of left lower leg: Secondary | ICD-10-CM

## 2020-07-14 DIAGNOSIS — M9261 Juvenile osteochondrosis of tarsus, right ankle: Secondary | ICD-10-CM

## 2020-07-14 DIAGNOSIS — M216X1 Other acquired deformities of right foot: Secondary | ICD-10-CM | POA: Diagnosis not present

## 2020-07-14 DIAGNOSIS — M6528 Calcific tendinitis, other site: Secondary | ICD-10-CM

## 2020-07-14 DIAGNOSIS — M216X2 Other acquired deformities of left foot: Secondary | ICD-10-CM

## 2020-07-14 DIAGNOSIS — M21861 Other specified acquired deformities of right lower leg: Secondary | ICD-10-CM

## 2020-07-14 DIAGNOSIS — M9262 Juvenile osteochondrosis of tarsus, left ankle: Secondary | ICD-10-CM

## 2020-07-14 DIAGNOSIS — M7730 Calcaneal spur, unspecified foot: Secondary | ICD-10-CM

## 2020-07-14 NOTE — Patient Instructions (Signed)
Look for Voltaren gel at the pharmacy over the counter or online (also known as diclofenac 1% gel). Apply to the painful areas 3-4x daily with the supplied dosing card. Allow to dry for 10 minutes before going into socks/shoes   Achilles Tendinitis  with Rehab Achilles tendinitis is a disorder of the Achilles tendon. The Achilles tendon connects the large calf muscles (Gastrocnemius and Soleus) to the heel bone (calcaneus). This tendon is sometimes called the heel cord. It is important for pushing-off and standing on your toes and is important for walking, running, or jumping. Tendinitis is often caused by overuse and repetitive microtrauma. SYMPTOMS Pain, tenderness, swelling, warmth, and redness may occur over the Achilles tendon even at rest. Pain with pushing off, or flexing or extending the ankle. Pain that is worsened after or during activity. CAUSES  Overuse sometimes seen with rapid increase in exercise programs or in sports requiring running and jumping. Poor physical conditioning (strength and flexibility or endurance). Running sports, especially training running down hills. Inadequate warm-up before practice or play or failure to stretch before participation. Injury to the tendon. PREVENTION  Warm up and stretch before practice or competition. Allow time for adequate rest and recovery between practices and competition. Keep up conditioning. Keep up ankle and leg flexibility. Improve or keep muscle strength and endurance. Improve cardiovascular fitness. Use proper technique. Use proper equipment (shoes, skates). To help prevent recurrence, taping, protective strapping, or an adhesive bandage may be recommended for several weeks after healing is complete. PROGNOSIS  Recovery may take weeks to several months to heal. Longer recovery is expected if symptoms have been prolonged. Recovery is usually quicker if the inflammation is due to a direct blow as compared with overuse or  sudden strain. RELATED COMPLICATIONS  Healing time will be prolonged if the condition is not correctly treated. The injury must be given plenty of time to heal. Symptoms can reoccur if activity is resumed too soon. Untreated, tendinitis may increase the risk of tendon rupture requiring additional time for recovery and possibly surgery. TREATMENT  The first treatment consists of rest anti-inflammatory medication, and ice to relieve the pain. Stretching and strengthening exercises after resolution of pain will likely help reduce the risk of recurrence. Referral to a physical therapist or athletic trainer for further evaluation and treatment may be helpful. A walking boot or cast may be recommended to rest the Achilles tendon. This can help break the cycle of inflammation and microtrauma. Arch supports (orthotics) may be prescribed or recommended by your caregiver as an adjunct to therapy and rest. Surgery to remove the inflamed tendon lining or degenerated tendon tissue is rarely necessary but may be indicated in certain cases. MEDICATION  Nonsteroidal anti-inflammatory medications, such as aspirin and ibuprofen, may be used for pain and inflammation relief. Do not take within 7 days before surgery. Take these as directed by your caregiver. Contact your caregiver immediately if any bleeding, stomach upset, or signs of allergic reaction occur. Other minor pain relievers, such as acetaminophen, may also be used. Pain relievers may be prescribed as necessary by your caregiver. Do not take prescription pain medication for longer than 4 to 7 days. Use only as directed and only as much as you need. Cortisone injections are rarely indicated. Cortisone injections may weaken tendons and predispose to rupture. It is better to give the condition more time to heal than to use them. HEAT AND COLD Cold is used to relieve pain and reduce inflammation for acute and chronic Achilles  tendinitis. Cold should be applied  for 10 to 15 minutes every 2 to 3 hours for inflammation and pain and immediately after any activity that aggravates your symptoms. Use ice packs or an ice massage. Heat may be used before performing stretching and strengthening activities prescribed by your caregiver. Use a heat pack or a warm soak. SEEK MEDICAL CARE IF: Symptoms get worse or do not improve in 2 weeks despite treatment. New, unexplained symptoms develop. Drugs used in treatment may produce side effects.  EXERCISES:  RANGE OF MOTION (ROM) AND STRETCHING EXERCISES - Achilles Tendinitis  These exercises may help you when beginning to rehabilitate your injury. Your symptoms may resolve with or without further involvement from your physician, physical therapist or athletic trainer. While completing these exercises, remember:  Restoring tissue flexibility helps normal motion to return to the joints. This allows healthier, less painful movement and activity. An effective stretch should be held for at least 30 seconds. A stretch should never be painful. You should only feel a gentle lengthening or release in the stretched tissue.  STRETCH  Gastroc, Standing  Place hands on wall. Extend right / left leg, keeping the front knee somewhat bent. Slightly point your toes inward on your back foot. Keeping your right / left heel on the floor and your knee straight, shift your weight toward the wall, not allowing your back to arch. You should feel a gentle stretch in the right / left calf. Hold this position for 10 seconds. Repeat 3 times. Complete this stretch 2 times per day.  STRETCH  Soleus, Standing  Place hands on wall. Extend right / left leg, keeping the other knee somewhat bent. Slightly point your toes inward on your back foot. Keep your right / left heel on the floor, bend your back knee, and slightly shift your weight over the back leg so that you feel a gentle stretch deep in your back calf. Hold this position for 10  seconds. Repeat 3 times. Complete this stretch 2 times per day.  STRETCH  Gastrocsoleus, Standing  Note: This exercise can place a lot of stress on your foot and ankle. Please complete this exercise only if specifically instructed by your caregiver.  Place the ball of your right / left foot on a step, keeping your other foot firmly on the same step. Hold on to the wall or a rail for balance. Slowly lift your other foot, allowing your body weight to press your heel down over the edge of the step. You should feel a stretch in your right / left calf. Hold this position for 10 seconds. Repeat this exercise with a slight bend in your knee. Repeat 3 times. Complete this stretch 2 times per day.   STRENGTHENING EXERCISES - Achilles Tendinitis These exercises may help you when beginning to rehabilitate your injury. They may resolve your symptoms with or without further involvement from your physician, physical therapist or athletic trainer. While completing these exercises, remember:  Muscles can gain both the endurance and the strength needed for everyday activities through controlled exercises. Complete these exercises as instructed by your physician, physical therapist or athletic trainer. Progress the resistance and repetitions only as guided. You may experience muscle soreness or fatigue, but the pain or discomfort you are trying to eliminate should never worsen during these exercises. If this pain does worsen, stop and make certain you are following the directions exactly. If the pain is still present after adjustments, discontinue the exercise until you can discuss the  trouble with your clinician.  STRENGTH - Plantar-flexors  Sit with your right / left leg extended. Holding onto both ends of a rubber exercise band/tubing, loop it around the ball of your foot. Keep a slight tension in the band. Slowly push your toes away from you, pointing them downward. Hold this position for 10 seconds. Return  slowly, controlling the tension in the band/tubing. Repeat 3 times. Complete this exercise 2 times per day.   STRENGTH - Plantar-flexors  Stand with your feet shoulder width apart. Steady yourself with a wall or table using as little support as needed. Keeping your weight evenly spread over the width of your feet, rise up on your toes.* Hold this position for 10 seconds. Repeat 3 times. Complete this exercise 2 times per day.  *If this is too easy, shift your weight toward your right / left leg until you feel challenged. Ultimately, you may be asked to do this exercise with your right / left foot only.  STRENGTH  Plantar-flexors, Eccentric  Note: This exercise can place a lot of stress on your foot and ankle. Please complete this exercise only if specifically instructed by your caregiver.  Place the balls of your feet on a step. With your hands, use only enough support from a wall or rail to keep your balance. Keep your knees straight and rise up on your toes. Slowly shift your weight entirely to your right / left toes and pick up your opposite foot. Gently and with controlled movement, lower your weight through your right / left foot so that your heel drops below the level of the step. You will feel a slight stretch in the back of your calf at the end position. Use the healthy leg to help rise up onto the balls of both feet, then lower weight only on the right / left leg again. Build up to 15 repetitions. Then progress to 3 consecutive sets of 15 repetitions.* After completing the above exercise, complete the same exercise with a slight knee bend (about 30 degrees). Again, build up to 15 repetitions. Then progress to 3 consecutive sets of 15 repetitions.* Perform this exercise 2 times per day.  *When you easily complete 3 sets of 15, your physician, physical therapist or athletic trainer may advise you to add resistance by wearing a backpack filled with additional weight.  STRENGTH - Plantar  Flexors, Seated  Sit on a chair that allows your feet to rest flat on the ground. If necessary, sit at the edge of the chair. Keeping your toes firmly on the ground, lift your right / left heel as far as you can without increasing any discomfort in your ankle. Repeat 3 times. Complete this exercise 2 times a day.  

## 2020-07-15 ENCOUNTER — Other Ambulatory Visit: Payer: Self-pay | Admitting: Podiatry

## 2020-07-15 DIAGNOSIS — M6528 Calcific tendinitis, other site: Secondary | ICD-10-CM

## 2020-07-18 ENCOUNTER — Encounter: Payer: Self-pay | Admitting: Podiatry

## 2020-07-18 NOTE — Progress Notes (Signed)
  Subjective:  Patient ID: Isaiah Hicks, male    DOB: 1980-02-19,  MRN: [id number]  Chief Complaint  Patient presents with   Foot Pain    Patient presents today for bilat heel/achilles pain x 4-5 months.  He says now his right is worse than left and "it felt like my tendons tore in both heels on Monday"    40 y.o. male presents with the above complaint. History confirmed with patient.  He works at the [employer] and has to wear heavy-duty boots, he is on his feet all day.  Describes it as a stabbing tearing type pain.  Always worse by the end of the day.  Worse when pushing off and toe off during gait.  Has tried stretching frozen water bottle meloxicam without relief.  He says that he was diagnosed with heel spurs several years ago  Objective:  Physical Exam: warm, good capillary refill, no trophic changes or ulcerative lesions, normal DP and PT pulses, and normal sensory exam. Bilateral foot: tenderness at Achilles tendon insertion, gastrocnemius equinus is noted with a positive silverskiold test, and palpable bony prominences posterior heel   Radiographs: Multiple views x-ray of both feet: posterior calcaneal spur and Haglund deformity noted both are quite large with calcifications in the distal Achilles Assessment:   1. 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 Gastrocnemius equinus of left lower extremity   3. Gastrocnemius equinus of right lower extremity   4. Haglund's deformity of both heels      Plan:  Patient was evaluated and treated and all questions answered.   Discussed the etiology and treatment options for Achilles tendinitis including stretching, formal physical therapy with an eccentric exercises therapy plan, supportive shoegears such as a running shoe or sneaker, heel lifts, topical and oral medications.  We also discussed that I do not routinely perform injections in this area because of the risk of an increased damage or rupture of  the tendon.  We also discussed the role of surgical treatment of this for patients who do not improve after exhausting non-surgical treatment options.  -XR reviewed with patient -Educated on stretching and icing of the affected limb. -Rx for Meloxicam. Advised on risks, benefits, and alternatives of the medication -CAM boot dispensed -I think due to the longstanding chronicity as well as the severity of pain large spurs and calcification of the tendon he is not likely to improve with physical therapy EPAT or Topaz procedures.  He will begin home physical therapy now.  Discussed with him I think we should probably look at planning for surgical repair.  I am ordering bilateral MRIs for surgical planning.  We discussed the procedure recovery process and the risk benefits and potential complications.  We will discuss this further next visit.  He will likely need 4 months off work before he is able to return for each foot.   Return in about 1 month (around 08/14/2020) for after MRI to review, re-check Achilles tendon.

## 2020-07-20 ENCOUNTER — Other Ambulatory Visit: Payer: 59

## 2020-07-21 ENCOUNTER — Telehealth: Payer: Self-pay | Admitting: Podiatry

## 2020-07-21 NOTE — Telephone Encounter (Signed)
Pt. Wife called and ask for peer to peer  for mri approval. [phone]

## 2020-07-28 ENCOUNTER — Telehealth (HOSPITAL_COMMUNITY): Payer: Self-pay | Admitting: Physical Therapy

## 2020-07-28 NOTE — Telephone Encounter (Signed)
S.w wife Isaiah Hicks) she requested the referral be closed due to Mylan will be having surgery asap. The surgeon will send a new referral after surgery for PT eval and tx

## 2020-07-29 ENCOUNTER — Ambulatory Visit (HOSPITAL_COMMUNITY): Payer: 59 | Admitting: Physical Therapy

## 2020-08-25 ENCOUNTER — Other Ambulatory Visit: Payer: Self-pay

## 2020-08-25 ENCOUNTER — Encounter: Payer: Self-pay | Admitting: Family Medicine

## 2020-08-25 ENCOUNTER — Ambulatory Visit: Payer: 59 | Admitting: Family Medicine

## 2020-08-25 VITALS — BP 124/85 | Ht 72.0 in | Wt 253.0 lb

## 2020-08-25 DIAGNOSIS — G4730 Sleep apnea, unspecified: Secondary | ICD-10-CM

## 2020-08-25 DIAGNOSIS — I10 Essential (primary) hypertension: Secondary | ICD-10-CM | POA: Diagnosis not present

## 2020-08-25 DIAGNOSIS — F341 Dysthymic disorder: Secondary | ICD-10-CM | POA: Diagnosis not present

## 2020-08-25 MED ORDER — SERTRALINE HCL 100 MG PO TABS: 100.0000 mg | ORAL_TABLET | Freq: Every day | ORAL | 3 refills | Status: DC

## 2020-08-25 NOTE — Progress Notes (Signed)
   Subjective:    Patient ID: Isaiah Hicks, male    DOB: March 29, 1980, 41 y.o.   MRN: [id number]  HPI Patient arrives for a follow up on sertraline and testosterone injections.  Testosterone under the care of urology  Patient seen urology currently for management of low testosterone  Patient states he doesn't see a big change with sertraline-patient had a spell where he just felt lack of interest lack of joy he tried to get an appointment here but it was several weeks to get in so they did a video appointment the doctor started him on sertraline 50 mg he states it is helping some but not a lot  Snores Has apnea-severe snoring with apnea spells present over the past several months more progressive.  Daytime somnolence sleepiness sometimes gets sleepy with long drives is not doing any driving right now going through rehab from the left Achilles surgery  Denies being suicidal. Review of Systems     Objective:   Physical Exam  General-in no acute distress Eyes-no discharge Lungs-respiratory rate normal, CTA CV-no murmurs,RRR Extremities skin warm dry no edema Neuro grossly normal Behavior normal, alert       Assessment & Plan:  1. Essential hypertension Blood pressure decent control continue current measures watch diet  2. Sleep apnea, unspecified type Probable sleep apnea.  Recommend split study.  Recommend this to be done near future more than likely will need CPAP patient understands - Split night study; Future  3. 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

## 2020-08-27 ENCOUNTER — Encounter: Payer: Self-pay | Admitting: Podiatry

## 2020-09-01 ENCOUNTER — Encounter (HOSPITAL_BASED_OUTPATIENT_CLINIC_OR_DEPARTMENT_OTHER): Payer: Self-pay

## 2020-09-01 DIAGNOSIS — G4709 Other insomnia: Secondary | ICD-10-CM

## 2020-09-01 DIAGNOSIS — R5383 Other fatigue: Secondary | ICD-10-CM

## 2020-09-01 DIAGNOSIS — R0683 Snoring: Secondary | ICD-10-CM

## 2020-09-21 ENCOUNTER — Telehealth (INDEPENDENT_AMBULATORY_CARE_PROVIDER_SITE_OTHER): Payer: 59 | Admitting: Family Medicine

## 2020-09-21 ENCOUNTER — Other Ambulatory Visit: Payer: Self-pay

## 2020-09-21 ENCOUNTER — Telehealth: Payer: Self-pay

## 2020-09-21 DIAGNOSIS — F341 Dysthymic disorder: Secondary | ICD-10-CM | POA: Diagnosis not present

## 2020-09-21 NOTE — Progress Notes (Signed)
  I connected with  Savier Trickett Didonato on 09/21/20 by a phone enabled telemedicine application and verified that I am speaking with the correct person using two identifiers.   I discussed the limitations of evaluation and management by telemedicine. The patient expressed understanding and agreed to proceed.  Patient location: home  Provider location: in office  I provided 12 minutes of non face - to - face time during this encounter.  Subjective:    Patient ID: Isaiah Hicks, male    DOB: [birth year], 40 y.o.   MRN: [id number]  Depression       Currently on sertraline 100 mg 1 daily  Patient currently taking medication he states he really does not see any benefit at this point he denies being depressed he just states he has very little energy and no interest in doing things he has had previous lab work which showed low testosterone he is being managed by urology they have him on testosterone he states he has not really seen any benefits at this point he does have a lot of snoring difficulty sleeping has a sleep study coming up  Review of Systems  Psychiatric/Behavioral:  Positive for depression.       Objective:   Physical Exam Today's visit was via telephone Physical exam was not possible for this visit        Assessment & Plan:  Depression seemingly doing fair with his current medicine but not getting worse may be a little better but not by much not suicidal  Significant time spent with patient discussing the sleep apnea testing plus also help take several weeks to get the results back in for him to give [place] a call if he has not heard anything within 3 to 4 weeks of the test

## 2020-09-21 NOTE — Telephone Encounter (Signed)
I connected with  Isaiah Hicks on 09/21/20 by a video enabled telemedicine application and verified that I am speaking with the correct person using two identifiers.   I discussed the limitations of evaluation and management by telemedicine. The patient expressed understanding and agreed to proceed.

## 2020-09-27 ENCOUNTER — Other Ambulatory Visit: Payer: Self-pay

## 2020-09-27 ENCOUNTER — Ambulatory Visit (HOSPITAL_BASED_OUTPATIENT_CLINIC_OR_DEPARTMENT_OTHER): Payer: 59 | Attending: Urology | Admitting: Internal Medicine

## 2020-09-27 VITALS — Ht 73.0 in | Wt 275.0 lb

## 2020-09-27 DIAGNOSIS — R0683 Snoring: Secondary | ICD-10-CM

## 2020-09-27 DIAGNOSIS — G4730 Sleep apnea, unspecified: Secondary | ICD-10-CM | POA: Diagnosis present

## 2020-09-27 DIAGNOSIS — R5383 Other fatigue: Secondary | ICD-10-CM

## 2020-09-27 DIAGNOSIS — G4709 Other insomnia: Secondary | ICD-10-CM

## 2020-09-27 DIAGNOSIS — G4733 Obstructive sleep apnea (adult) (pediatric): Secondary | ICD-10-CM

## 2020-10-03 DIAGNOSIS — G4730 Sleep apnea, unspecified: Secondary | ICD-10-CM | POA: Diagnosis not present

## 2020-10-03 NOTE — Procedures (Signed)
    Patient Name: Isaiah Hicks, Pendry Date: 09/27/2020 Gender: Male D.O.B: 1980/09/07 Age (years): 40 Referring Provider: Dorian Furnace Winter MD Height (inches): 73 Interpreting Physician: Jetty Duhamel MD, ABSM Weight (lbs): 275 RPSGT: Rosette Reveal BMI: 36 MRN: [id number] Neck Size: 18.00  CLINICAL INFORMATION Sleep Study Type: NPSG Indication for sleep study: Hypertension Epworth Sleepiness Score: 11  SLEEP STUDY TECHNIQUE As per the AASM Manual for the Scoring of Sleep and Associated Events v2.3 (April 2016) with a hypopnea requiring 4% desaturations.  The channels recorded and monitored were frontal, central and occipital EEG, electrooculogram (EOG), submentalis EMG (chin), nasal and oral airflow, thoracic and abdominal wall motion, anterior tibialis EMG, snore microphone, electrocardiogram, and pulse oximetry.  MEDICATIONS Medications self-administered by patient taken the night of the study : none reported  SLEEP ARCHITECTURE The study was initiated at 11:02:51 PM and ended at 5:04:27 AM.  Sleep onset time was 90.4 minutes and the sleep efficiency was 25.4%%. The total sleep time was 92 minutes.  Stage REM latency was N/A minutes.  The patient spent 67.9%% of the night in stage N1 sleep, 32.1%% in stage N2 sleep, 0.0%% in stage N3 and 0% in REM.  Alpha intrusion was absent.  Supine sleep was 28.26%.  RESPIRATORY PARAMETERS The overall apnea/hypopnea index (AHI) was 83.5 per hour. There were 83 total apneas, including 77 obstructive, 6 central and 0 mixed apneas. There were 45 hypopneas and 3 RERAs.  The AHI during Stage REM sleep was N/A per hour.  AHI while supine was 90.0 per hour.  The mean oxygen saturation was 91.0%. The minimum SpO2 during sleep was 84.0%.  moderate snoring was noted during this study.  CARDIAC DATA The 2 lead EKG demonstrated sinus rhythm. The mean heart rate was 62.4 beats per minute. Other EKG findings include:  None.  LEG MOVEMENT DATA The total PLMS were 0 with a resulting PLMS index of 0.0. Associated arousal with leg movement index was 0.0 .  IMPRESSIONS - Severe obstructive sleep apnea occurred during this study (AHI = 83.5/h). - Insufficient sleep to meet protocol criteria for split night CPAP titration. Patient had significant difficulty initiating and maintaining sleep, but when asleep, apnea was severe. - No significant central sleep apnea occurred during this study (CAI = 3.9/h). - Mild oxygen desaturation was noted during this study (Min O2 = 84.0%). Mean O2 saturation 92.4% - The patient snored with moderate snoring volume. - No cardiac abnormalities were noted during this study. - Clinically significant periodic limb movements did not occur during sleep. No significant associated arousals.  DIAGNOSIS - Obstructive Sleep Apnea (G47.33)  RECOMMENDATIONS - Suggest CPAP titration sleep study or autopap. - Patient may need consideration of aid for insomnia as well, deepending on exxperiwnce with CPAP in home environment. - Sleep hygiene should be reviewed to assess factors that may improve sleep quality. - Weight management and regular exercise should be initiated or continued if appropriate.  [Electronically signed] 10/03/2020 10:41 AM  Jetty Duhamel MD, ABSM Diplomate, American Board of Sleep Medicine   NPI: [id number]                         Jetty Duhamel Diplomate, American Board of Sleep Medicine  ELECTRONICALLY SIGNED ON:  10/03/2020, 10:36 AM [facility] PH: [phone]   FX: [phone] ACCREDITED BY THE AMERICAN ACADEMY OF SLEEP MEDICINE

## 2020-10-22 ENCOUNTER — Other Ambulatory Visit: Payer: Self-pay | Admitting: Family Medicine

## 2020-10-25 ENCOUNTER — Encounter: Payer: Self-pay | Admitting: Family Medicine

## 2020-10-26 NOTE — Telephone Encounter (Signed)
Nurses In regards to Isaiah Hicks's message He does have severe sleep apnea. It is recommended to do a CPAP titration or utilize a auto CPAP. I would recommend a office visit next week (This week my schedule is already full due to provider shortages at our office) We can discuss the sleep study next week and discuss how he would like to proceed from there thank you

## 2020-11-05 ENCOUNTER — Ambulatory Visit: Payer: 59 | Admitting: Family Medicine

## 2020-11-05 ENCOUNTER — Other Ambulatory Visit: Payer: Self-pay

## 2020-11-05 VITALS — BP 142/87 | Wt 283.2 lb

## 2020-11-05 DIAGNOSIS — G4733 Obstructive sleep apnea (adult) (pediatric): Secondary | ICD-10-CM | POA: Diagnosis not present

## 2020-11-05 NOTE — Patient Instructions (Signed)
Living With Sleep Apnea Sleep apnea is a condition in which breathing pauses or becomes shallow during sleep. Sleep apnea is most commonly caused by a collapsed or blocked airway. People with sleep apnea usually snore loudly. They may have times when they gasp and stop breathing for 10 seconds or more during sleep. This may happenmany times during the night. The breaks in breathing also interrupt the deep sleep that you need to feel rested. Even if you do not completely wake up from the gaps in breathing, your sleep may not be restful and you feel tired during the day. You may also have a headache in the morning and low energy during the day, and you may feel anxiousor depressed. How can sleep apnea affect me? Sleep apnea increases your chances of extreme tiredness during the day (daytime fatigue). It can also increase your risk for health conditions, such as: Heart attack. Stroke. Obesity. Type 2 diabetes. Heart failure. Irregular heartbeat. High blood pressure. If you have daytime fatigue as a result of sleep apnea, you may be more likely to: Perform poorly at school or work. Fall asleep while driving. Have difficulty with attention. Develop depression or anxiety. Have sexual dysfunction. What actions can I take to manage sleep apnea? Sleep apnea treatment  If you were given a device to open your airway while you sleep, use it only as told by your health care provider. You may be given: An oral appliance. This is a custom-made mouthpiece that shifts your lower jaw forward. A continuous positive airway pressure (CPAP) device. This device blows air through a mask when you breathe out (exhale). A nasal expiratory positive airway pressure (EPAP) device. This device has valves that you put into each nostril. A bi-level positive airway pressure (BPAP) device. This device blows air through a mask when you breathe in (inhale) and breathe out (exhale). You may need surgery if other treatments do  not work for you.  Sleep habits Go to sleep and wake up at the same time every day. This helps set your internal clock (circadian rhythm) for sleeping. If you stay up later than usual, such as on weekends, try to get up in the morning within 2 hours of your normal wake time. Try to get at least 7-9 hours of sleep each night. Stop using a computer, tablet, and mobile phone a few hours before bedtime. Do not take long naps during the day. If you nap, limit it to 30 minutes. Have a relaxing bedtime routine. Reading or listening to music may relax you and help you sleep. Use your bedroom only for sleep. Keep your television and computer out of your bedroom. Keep your bedroom cool, dark, and quiet. Use a supportive mattress and pillows. Follow your health care provider's instructions for other changes to sleep habits. Nutrition Do not eat heavy meals in the evening. Do not have caffeine in the later part of the day. The effects of caffeine can last for more than 5 hours. Follow your health care provider's or dietitian's instructions for any diet changes. Lifestyle     Do not drink alcohol before bedtime. Alcohol can cause you to fall asleep at first, but then it can cause you to wake up in the middle of the night and have trouble getting back to sleep. Do not use any products that contain nicotine or tobacco. These products include cigarettes, chewing tobacco, and vaping devices, such as e-cigarettes. If you need help quitting, ask your health care provider. Medicines Take over-the-counter   and prescription medicines only as told by your health care provider. Do not use over-the-counter sleep medicine. You can become dependent on this medicine, and it can make sleep apnea worse. Do not use medicines, such as sedatives and narcotics, unless told by your health care provider. Activity Exercise on most days, but avoid exercising in the evening. Exercising near bedtime can interfere with  sleeping. If possible, spend time outside every day. Natural light helps regulate your circadian rhythm. General information Lose weight if you need to, and maintain a healthy weight. Keep all follow-up visits. This is important. If you are having surgery, make sure to tell your health care provider that you have sleep apnea. You may need to bring your device with you. Where to find more information Learn more about sleep apnea and daytime fatigue from: American Sleep Association: sleepassociation.org National Sleep Foundation: sleepfoundation.org National Heart, Lung, and Blood Institute: nhlbi.nih.gov Summary Sleep apnea is a condition in which breathing pauses or becomes shallow during sleep. Sleep apnea can cause daytime fatigue and other serious health conditions. You may need to wear a device while sleeping to help keep your airway open. If you are having surgery, make sure to tell your health care provider that you have sleep apnea. You may need to bring your device with you. Making changes to sleep habits, diet, lifestyle, and activity can help you manage sleep apnea. This information is not intended to replace advice given to you by your health care provider. Make sure you discuss any questions you have with your healthcare provider. Document Revised: 12/05/2019 Document Reviewed: 12/05/2019 Elsevier Patient Education  2022 Elsevier Inc.  

## 2020-11-05 NOTE — Progress Notes (Signed)
   Subjective:    Patient ID: Isaiah Hicks, male    DOB: Oct 19, 1980, 40 y.o.   MRN: [id number]  HPI  Patient arrives to discuss results of recent sleep study. Today we had a good 15-minute conversation on sleep apnea the mechanisms behind at the end of treatment.  We also went over the approach to this.  In CPAP and what it can do for him.  He is interested in starting it.  We did go over his sleep study including AHI of 83 and O2 desaturations to 86%  Review of Systems     Objective:   Physical Exam        Assessment & Plan:  Severe sleep apnea Recommend CPAP Recommend auto titration We will go ahead forward with the order on this patient will let us know when he gets the CPAP machine and we will follow-up within 30 days to see how he is doing with this

## 2020-11-08 NOTE — Progress Notes (Signed)
11/08/20-sent my chart message to patient to ask which home health supplier to use.

## 2020-11-18 NOTE — Progress Notes (Signed)
Order for CPAP sent to [facility]

## 2020-12-03 IMAGING — DX DG CHEST 1V PORT
1 series · 1 of 1 positions shown · non-contrast
Comparison: 12/29/2013

CLINICAL DATA: Short of breath.  COVID positive

EXAM:
PORTABLE CHEST 1 VIEW

[chest ap]
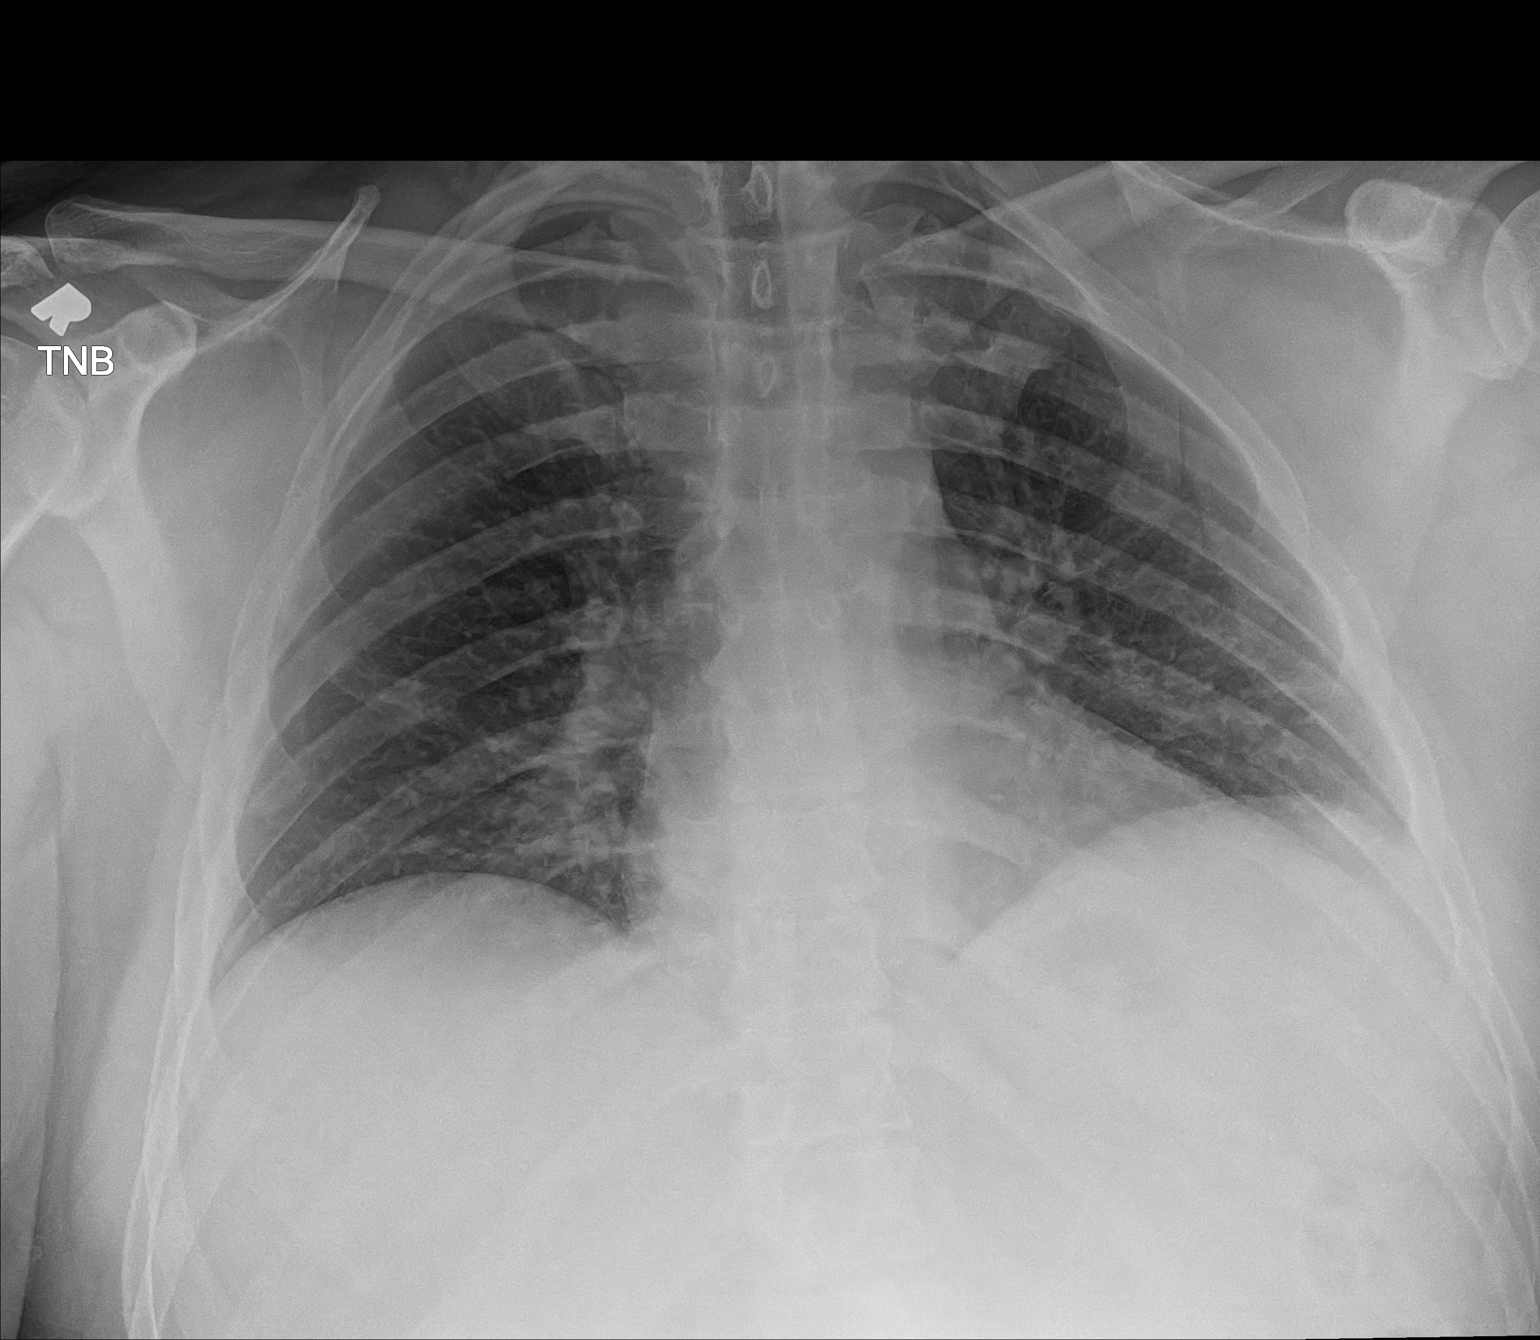

[1 of 1 positions shown; findings below may reference images not displayed]

FINDINGS: Mild bibasilar airspace disease. Decreased lung volume. No focal
effusion. Heart size and vascularity normal.
IMPRESSION: Decreased lung volume with mild bibasilar airspace disease most
likely atelectasis. Follow-up recommended to exclude developing
pneumonia.

## 2021-01-13 ENCOUNTER — Other Ambulatory Visit: Payer: Self-pay | Admitting: Family Medicine

## 2021-01-13 ENCOUNTER — Encounter: Payer: Self-pay | Admitting: Family Medicine

## 2021-01-13 MED ORDER — SERTRALINE HCL 100 MG PO TABS: 100.0000 mg | ORAL_TABLET | Freq: Every day | ORAL | 0 refills | Status: DC

## 2021-01-21 ENCOUNTER — Ambulatory Visit: Payer: 59 | Admitting: Family Medicine

## 2021-01-31 ENCOUNTER — Other Ambulatory Visit: Payer: Self-pay

## 2021-01-31 ENCOUNTER — Ambulatory Visit (INDEPENDENT_AMBULATORY_CARE_PROVIDER_SITE_OTHER): Payer: 59 | Admitting: Family Medicine

## 2021-01-31 VITALS — BP 124/84 | HR 84 | Temp 98.3°F | Ht 73.0 in | Wt 284.2 lb

## 2021-01-31 DIAGNOSIS — E7849 Other hyperlipidemia: Secondary | ICD-10-CM

## 2021-01-31 DIAGNOSIS — Z13220 Encounter for screening for lipoid disorders: Secondary | ICD-10-CM | POA: Diagnosis not present

## 2021-01-31 DIAGNOSIS — I10 Essential (primary) hypertension: Secondary | ICD-10-CM

## 2021-01-31 MED ORDER — GABAPENTIN 300 MG PO CAPS: 300.0000 mg | ORAL_CAPSULE | Freq: Every day | ORAL | 2 refills | Status: DC

## 2021-01-31 MED ORDER — SERTRALINE HCL 100 MG PO TABS: ORAL_TABLET | ORAL | 2 refills | Status: DC

## 2021-01-31 MED ORDER — AMLODIPINE BESYLATE 5 MG PO TABS: ORAL_TABLET | ORAL | 2 refills | Status: DC

## 2021-01-31 NOTE — Progress Notes (Signed)
° °  Subjective:    Patient ID: Isaiah Hicks, male    DOB: [birth year], 41 y.o.   MRN: [id number]  HPI Follow up on medications. Patient would like for the Zoloft to be increased. Essential hypertension - Plan: Basic Metabolic Panel (BMET)  Screening for lipid disorders - Plan: Lipid Profile  Other hyperlipidemia - Plan: Lipid Profile, Basic Metabolic Panel (BMET) Patient still has some fatigue and also finds himself at times feeling stressed denies being depressed.  Is interested in bumping up the dose of his sertraline Does take his blood pressure medicine regular basis watches salt in his intake  Review of Systems     Objective:   Physical Exam Lungs clear heart regular pulse normal extremities no edema       Assessment & Plan:   Patient has gained some weight after having Achilles tendon surgery.  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

## 2021-08-01 ENCOUNTER — Ambulatory Visit: Payer: 59 | Admitting: Family Medicine

## 2021-09-05 ENCOUNTER — Encounter: Payer: Self-pay | Admitting: Family Medicine

## 2021-09-05 ENCOUNTER — Ambulatory Visit: Payer: 59 | Admitting: Family Medicine

## 2021-09-05 VITALS — BP 134/76 | Wt 253.0 lb

## 2021-09-05 DIAGNOSIS — N289 Disorder of kidney and ureter, unspecified: Secondary | ICD-10-CM

## 2021-09-05 DIAGNOSIS — R7989 Other specified abnormal findings of blood chemistry: Secondary | ICD-10-CM

## 2021-09-05 DIAGNOSIS — M545 Low back pain, unspecified: Secondary | ICD-10-CM | POA: Diagnosis not present

## 2021-09-05 DIAGNOSIS — I10 Essential (primary) hypertension: Secondary | ICD-10-CM

## 2021-09-05 DIAGNOSIS — E782 Mixed hyperlipidemia: Secondary | ICD-10-CM | POA: Diagnosis not present

## 2021-09-05 MED ORDER — MELOXICAM 15 MG PO TABS: 15.0000 mg | ORAL_TABLET | Freq: Every day | ORAL | 1 refills | Status: DC

## 2021-09-05 MED ORDER — AMLODIPINE BESYLATE 5 MG PO TABS: ORAL_TABLET | ORAL | 2 refills | Status: DC

## 2021-09-05 NOTE — Progress Notes (Signed)
   Subjective:    Patient ID: Isaiah Hicks, male    DOB: [birth year], 41 y.o.   MRN: [id number]  HPI Pt arrives today for follow up. Pt states he feels his blood pressure has been elevated. Pt states if he pork or something high in sodium, he can feel his face getting flushed. Pt has been taking 2 Amlodipine.   Patient states he has been eating healthier staying physically active bringing his weight down he is being followed by urology for low testosterone and he states its been a while since he has been seen because is very difficult to get an appointment  Essential hypertension - Plan: Microalbumin/Creatinine Ratio, Urine, CMP14+EGFR, Lipid panel, Ambulatory referral to Endocrinology  Mixed hyperlipidemia - Plan: Microalbumin/Creatinine Ratio, Urine, CMP14+EGFR, Lipid panel, Ambulatory referral to Endocrinology  Lumbar pain - Plan: Microalbumin/Creatinine Ratio, Urine, CMP14+EGFR, Lipid panel, Ambulatory referral to Endocrinology  Low testosterone - Plan: Microalbumin/Creatinine Ratio, Urine, CMP14+EGFR, Lipid panel, Ambulatory referral to Endocrinology  He does relate low back pain discomfort does not radiate down the legs tries to do some cross leg stretches  Patient also has history of hyperlipidemia and does need to have a follow-up on this  He does exercise on a regular basis including some cardio as well    Review of Systems     Objective:   Physical Exam  General-in no acute distress Eyes-no discharge Lungs-respiratory rate normal, CTA CV-no murmurs,RRR Extremities skin warm dry no edema Neuro grossly normal Behavior normal, alert       Assessment & Plan:  1. Essential hypertension Blood pressure decent control I would recommend going with the 5 mg that is what he has been taking the past several days and his blood pressure reading actually looks good so we will not go up on the dose - Microalbumin/Creatinine Ratio, Urine - CMP14+EGFR - Lipid panel -  Ambulatory referral to Endocrinology  2. Mixed hyperlipidemia Check labs healthy diet recommended May need statin at some point in his life - Microalbumin/Creatinine Ratio, Urine - CMP14+EGFR - Lipid panel - Ambulatory referral to Endocrinology  3. Lumbar pain More than likely due to disc disease stretches exercises shown May use meloxicam for as needed use - Microalbumin/Creatinine Ratio, Urine - CMP14+EGFR - Lipid panel - Ambulatory referral to Endocrinology  4. Low testosterone Patient is requesting to get established with endocrinology for this currently seeing urology but very difficult to follow through and get appointments with urologist - Microalbumin/Creatinine Ratio, Urine - CMP14+EGFR - Lipid panel - Ambulatory referral to Endocrinology

## 2021-09-24 LAB — MICROALBUMIN / CREATININE URINE RATIO
Creatinine, Urine: 232.1 mg/dL
Microalb/Creat Ratio: 6 mg/g creat (ref 0–29)
Microalbumin, Urine: 12.9 ug/mL

## 2021-09-24 LAB — CMP14+EGFR
ALT: 34 IU/L (ref 0–44)
AST: 41 IU/L — ABNORMAL HIGH (ref 0–40)
Albumin/Globulin Ratio: 2.1 (ref 1.2–2.2)
Albumin: 5 g/dL (ref 4.1–5.1)
Alkaline Phosphatase: 90 IU/L (ref 44–121)
BUN/Creatinine Ratio: 11 (ref 9–20)
BUN: 15 mg/dL (ref 6–24)
Bilirubin Total: 2.4 mg/dL — ABNORMAL HIGH (ref 0.0–1.2)
CO2: 22 mmol/L (ref 20–29)
Calcium: 9.7 mg/dL (ref 8.7–10.2)
Chloride: 99 mmol/L (ref 96–106)
Creatinine, Ser: 1.41 mg/dL — ABNORMAL HIGH (ref 0.76–1.27)
Globulin, Total: 2.4 g/dL (ref 1.5–4.5)
Glucose: 78 mg/dL (ref 70–99)
Potassium: 4.5 mmol/L (ref 3.5–5.2)
Sodium: 139 mmol/L (ref 134–144)
Total Protein: 7.4 g/dL (ref 6.0–8.5)
eGFR: 64 mL/min/{1.73_m2} (ref >59)

## 2021-09-24 LAB — LIPID PANEL
Chol/HDL Ratio: 3.6 ratio (ref 0.0–5.0)
Cholesterol, Total: 141 mg/dL (ref 100–199)
HDL: 39 mg/dL — ABNORMAL LOW (ref >39)
LDL Chol Calc (NIH): 88 mg/dL (ref 0–99)
Triglycerides: 71 mg/dL (ref 0–149)
VLDL Cholesterol Cal: 14 mg/dL (ref 5–40)

## 2021-11-01 ENCOUNTER — Encounter: Payer: Self-pay | Admitting: Family Medicine

## 2021-11-01 NOTE — Progress Notes (Signed)
Please mail to the patient 

## 2021-11-02 NOTE — Addendum Note (Signed)
Addended by: Dairl Ponder on: 11/02/2021 04:24 PM   Modules accepted: Orders

## 2022-03-08 ENCOUNTER — Ambulatory Visit (INDEPENDENT_AMBULATORY_CARE_PROVIDER_SITE_OTHER): Payer: 59 | Admitting: Family Medicine

## 2022-03-08 ENCOUNTER — Encounter: Payer: Self-pay | Admitting: Family Medicine

## 2022-03-08 VITALS — BP 124/78 | HR 66 | Wt 212.8 lb

## 2022-03-08 DIAGNOSIS — N289 Disorder of kidney and ureter, unspecified: Secondary | ICD-10-CM | POA: Diagnosis not present

## 2022-03-08 DIAGNOSIS — G4733 Obstructive sleep apnea (adult) (pediatric): Secondary | ICD-10-CM | POA: Diagnosis not present

## 2022-03-08 DIAGNOSIS — M7711 Lateral epicondylitis, right elbow: Secondary | ICD-10-CM | POA: Diagnosis not present

## 2022-03-08 DIAGNOSIS — I10 Essential (primary) hypertension: Secondary | ICD-10-CM | POA: Diagnosis not present

## 2022-03-08 MED ORDER — AMLODIPINE BESYLATE 5 MG PO TABS: ORAL_TABLET | ORAL | 2 refills | Status: DC

## 2022-03-08 MED ORDER — MELOXICAM 15 MG PO TABS: 15.0000 mg | ORAL_TABLET | Freq: Every day | ORAL | 1 refills | Status: DC

## 2022-03-08 NOTE — Progress Notes (Signed)
   Subjective:    Patient ID: Isaiah Hicks, male    DOB: 21-Dec-1980, 42 y.o.   MRN: [id number]  HPI Patient arrives today for six month follow up for medication. Patient states having pain in right elbow. Patient relates he is doing well Eating very healthy Reduced a lot of weight Because of this his CPAP machine mask does not fit anymore We will look into getting him a new mask He denies any chest tightness pressure pain shortness of breath He does get his testosterone every 2 weeks by shot and he also sees urology every 6 months.  He denies any particular worries or concerns currently  Essential hypertension - Plan: Basic Metabolic Panel (7)  Renal insufficiency - Plan: Basic Metabolic Panel (7)  Obstructive sleep apnea syndrome  Lateral epicondylitis of right elbow   Review of Systems     Objective:   Physical Exam General-in no acute distress Eyes-no discharge Lungs-respiratory rate normal, CTA CV-no murmurs,RRR Extremities skin warm dry no edema Neuro grossly normal Behavior normal, alert        Assessment & Plan:   1. Essential hypertension Blood pressure good control continue current measures check metabolic 7 - Basic Metabolic Panel (7)  2. Renal insufficiency Check metabolic 7 if creatinine still elevated needs further workup - Basic Metabolic Panel (7)  3. Obstructive sleep apnea syndrome Continue healthy diet continue to keep weight down in addition to this we will see if [facility] or [employer] can fit him for a new mask  4. Lateral epicondylitis of right elbow Right elbow pain after doing some weightlifting he will try ice or try anti-inflammatory if he does not see improvement in 7 to 14 days notify us we will help set up with orthopedics for an injection stretches handout given

## 2022-03-09 LAB — BASIC METABOLIC PANEL (7)
BUN/Creatinine Ratio: 21 — ABNORMAL HIGH (ref 9–20)
BUN: 25 mg/dL — ABNORMAL HIGH (ref 6–24)
CO2: 27 mmol/L (ref 20–29)
Chloride: 101 mmol/L (ref 96–106)
Creatinine, Ser: 1.17 mg/dL (ref 0.76–1.27)
Glucose: 62 mg/dL — ABNORMAL LOW (ref 70–99)
Potassium: 5.1 mmol/L (ref 3.5–5.2)
Sodium: 142 mmol/L (ref 134–144)
eGFR: 80 mL/min/{1.73_m2} (ref >59)

## 2022-03-12 ENCOUNTER — Telehealth: Payer: Self-pay | Admitting: Family Medicine

## 2022-03-12 NOTE — Telephone Encounter (Signed)
Nurses Patient is requesting that he get a new facial mask for his CPAP machine he states the old one was not connecting well.  He is uncertain if he is getting that through [facility] or through [place]  (He believes he is established with 1 of these) not quite sure what it takes to get a new mask.  Recently patient has lost a lot of weight.  Also it would be nice to be able to do have the agency do a digital download of the use of his CPAP machine and effectiveness thank you

## 2022-03-14 NOTE — Telephone Encounter (Signed)
Patient returned call and patient and wife were made aware of notes.

## 2022-03-14 NOTE — Telephone Encounter (Signed)
Left message to return call to find out company patient uses and ask him to get them to send a copy of digital download

## 2022-03-16 ENCOUNTER — Encounter (HOSPITAL_COMMUNITY): Payer: Self-pay | Admitting: Emergency Medicine

## 2022-03-16 ENCOUNTER — Emergency Department (HOSPITAL_COMMUNITY)
Admission: EM | Admit: 2022-03-16 | Discharge: 2022-03-17 | Disposition: A | Discharge status: Home / Self Care | Payer: 59 | Attending: Emergency Medicine | Admitting: Emergency Medicine

## 2022-03-16 ENCOUNTER — Other Ambulatory Visit: Payer: Self-pay

## 2022-03-16 DIAGNOSIS — R519 Headache, unspecified: Secondary | ICD-10-CM | POA: Insufficient documentation

## 2022-03-16 DIAGNOSIS — I10 Essential (primary) hypertension: Secondary | ICD-10-CM | POA: Diagnosis not present

## 2022-03-16 NOTE — ED Triage Notes (Signed)
Pt c/o of a headache that started while exercising Monday. Pt states it started on right frontal lobe and radiated to right side of head into right neck. Pt saw pcp and was told to have a CT of head if not improved by Thursday due to family hx of brain aneurisms. Pt endorses light sensitivities. Pt denies n/v and dizziness.

## 2022-03-17 ENCOUNTER — Emergency Department (HOSPITAL_COMMUNITY): Payer: 59

## 2022-03-17 ENCOUNTER — Telehealth: Payer: Self-pay

## 2022-03-17 LAB — COMPREHENSIVE METABOLIC PANEL
ALT: 38 U/L (ref 0–44)
AST: 54 U/L — ABNORMAL HIGH (ref 15–41)
Albumin: 4.1 g/dL (ref 3.5–5.0)
Alkaline Phosphatase: 64 U/L (ref 38–126)
Anion gap: 7 (ref 5–15)
BUN: 22 mg/dL — ABNORMAL HIGH (ref 6–20)
CO2: 25 mmol/L (ref 22–32)
Calcium: 8.7 mg/dL — ABNORMAL LOW (ref 8.9–10.3)
Chloride: 104 mmol/L (ref 98–111)
Creatinine, Ser: 1.09 mg/dL (ref 0.61–1.24)
GFR, Estimated: >60 mL/min (ref >60)
Glucose, Bld: 99 mg/dL (ref 70–99)
Potassium: 4 mmol/L (ref 3.5–5.1)
Sodium: 136 mmol/L (ref 135–145)
Total Bilirubin: 1.5 mg/dL — ABNORMAL HIGH (ref 0.3–1.2)
Total Protein: 6.9 g/dL (ref 6.5–8.1)

## 2022-03-17 LAB — CBC
HCT: 45.2 % (ref 39.0–52.0)
Hemoglobin: 14.6 g/dL (ref 13.0–17.0)
MCH: 26 pg (ref 26.0–34.0)
MCHC: 32.3 g/dL (ref 30.0–36.0)
MCV: 80.4 fL (ref 80.0–100.0)
Platelets: 192 10*3/uL (ref 150–400)
RBC: 5.62 MIL/uL (ref 4.22–5.81)
RDW: 14.3 % (ref 11.5–15.5)
WBC: 6.1 10*3/uL (ref 4.0–10.5)
nRBC: 0 % (ref 0.0–0.2)

## 2022-03-17 MED ORDER — PROCHLORPERAZINE EDISYLATE 10 MG/2ML IJ SOLN
10.0000 mg | Freq: Once | INTRAMUSCULAR | Status: AC
  Administered 2022-03-17: 10 mg via INTRAVENOUS
  Filled 2022-03-17: qty 2

## 2022-03-17 MED ORDER — DIPHENHYDRAMINE HCL 50 MG/ML IJ SOLN
25.0000 mg | Freq: Once | INTRAMUSCULAR | Status: AC
  Administered 2022-03-17: 25 mg via INTRAVENOUS
  Filled 2022-03-17: qty 1

## 2022-03-17 MED ORDER — IOHEXOL 350 MG/ML SOLN
75.0000 mL | Freq: Once | INTRAVENOUS | Status: AC | PRN
  Administered 2022-03-17: 75 mL via INTRAVENOUS

## 2022-03-17 MED ORDER — SODIUM CHLORIDE 0.9 % IV BOLUS
1000.0000 mL | Freq: Once | INTRAVENOUS | Status: AC
  Administered 2022-03-17: 1000 mL via INTRAVENOUS

## 2022-03-17 NOTE — Transitions of Care (Post Inpatient/ED Visit) (Unsigned)
   03/17/2022  Name: Isaiah Hicks MRN: [id number] DOB: 1980-03-22  Today's TOC FU Call Status: Today's TOC FU Call Status:: Unsuccessul Call (1st Attempt) Unsuccessful Call (1st Attempt) Date: 03/17/22  Attempted to reach the patient regarding the most recent Inpatient/ED visit.  Follow Up Plan: Additional outreach attempts will be made to reach the patient to complete the Transitions of Care (Post Inpatient/ED visit) call.   Signature Juanda Crumble, [place] Direct Dial [phone]

## 2022-03-17 NOTE — ED Notes (Signed)
Pt in CT.

## 2022-03-17 NOTE — ED Provider Notes (Signed)
[facility] Emergency Department Provider Note MRN:  [id number]  Arrival date & time: 03/17/22     Chief Complaint   Headache History of Present Illness   Isaiah Hicks is a 42 y.o. year-old male with a history of hypertension presenting to the ED with chief complaint of headache.  Sudden onset headache 3 days ago while dead lifting at the gym.  Was dead lifting over 300 pounds.  Headache has persisted, constant, right-sided pain since that time.  Never gets headaches.  Family history of brain aneurysms.  Denies numbness or weakness to the arms or legs.  Pain worse with bending over, worse with light.  Review of Systems  A thorough review of systems was obtained and all systems are negative except as noted in the HPI and PMH.   Patient's Health History    Past Medical History:  Diagnosis Date   Degenerative disc disease    Hypertension     Past Surgical History:  Procedure Laterality Date   c5-6 fusin     CERVICAL FUSION     c 5 and c 6   TONSILLECTOMY     WISDOM TOOTH EXTRACTION      Family History  Problem Relation Age of Onset   Hypertension Father     Social History   Socioeconomic History   Marital status: Married    Spouse name: Not on file   Number of children: Not on file   Years of education: Not on file   Highest education level: Not on file  Occupational History   Not on file  Tobacco Use   Smoking status: Never   Smokeless tobacco: Never  Vaping Use   Vaping Use: Never used  Substance and Sexual Activity   Alcohol use: No    Alcohol/week: 0.0 standard drinks of alcohol   Drug use: No   Sexual activity: Not on file  Other Topics Concern   Not on file  Social History Narrative   Not on file   Social Determinants of Health   Financial Resource Strain: Not on file  Food Insecurity: Not on file  Transportation Needs: Not on file  Physical Activity: Not on file  Stress: Not on file  Social Connections: Not on file   Intimate Partner Violence: Not on file     Physical Exam   Vitals:   03/17/22 0000 03/17/22 0100  BP: (!) 134/53 127/63  Pulse: 61 (!) 55  Resp: 18 17  Temp:    SpO2: 95% 99%    CONSTITUTIONAL: Well-appearing, NAD NEURO/PSYCH:  Alert and oriented x 3, normal and symmetric strength and sensation, normal coordination, normal speech EYES:  eyes equal and reactive ENT/NECK:  no LAD, no JVD CARDIO: Regular rate, well-perfused, normal S1 and S2 PULM:  CTAB no wheezing or rhonchi GI/GU:  non-distended, non-tender MSK/SPINE:  No gross deformities, no edema SKIN:  no rash, atraumatic   *Additional and/or pertinent findings included in MDM below  Diagnostic and Interventional Summary    EKG Interpretation  Date/Time:    Ventricular Rate:    PR Interval:    QRS Duration:   QT Interval:    QTC Calculation:   R Axis:     Text Interpretation:         Labs Reviewed  COMPREHENSIVE METABOLIC PANEL - Abnormal; Notable for the following components:      Result Value   BUN 22 (*)    Calcium 8.7 (*)    AST 54 (*)  Total Bilirubin 1.5 (*)    All other components within normal limits  CBC    CT ANGIO HEAD NECK W WO CM  Final Result      Medications  diphenhydrAMINE (BENADRYL) injection 25 mg (25 mg Intravenous Given 03/17/22 0058)  prochlorperazine (COMPAZINE) injection 10 mg (10 mg Intravenous Given 03/17/22 0059)  sodium chloride 0.9 % bolus 1,000 mL (0 mLs Intravenous Stopped 03/17/22 0206)  iohexol (OMNIPAQUE) 350 MG/ML injection 75 mL (75 mLs Intravenous Contrast Given 03/17/22 0128)     Procedures  /  Critical Care Procedures  ED Course and Medical Decision Making  Initial Impression and Ddx Concern for subarachnoid hemorrhage or aneurysmal bleeding given the history and family history.  Fortunately a reassuring neurological exam at this time, normal vital signs, no fever, no meningismus.  Awaiting CT imaging.  Past medical/surgical history that increases complexity  of ED encounter: Family history of brain aneurysms  Interpretation of Diagnostics I personally reviewed the laboratory assessment and my interpretation is as follows: No significant blood count or electrolyte disturbance  CT is normal.  Patient Reassessment and Ultimate Disposition/Management     Patient is feeling better upon reassessment, continued reassuring vital signs and neurological exam, appropriate for discharge.  Patient management required discussion with the following services or consulting groups:  None  Complexity of Problems Addressed Acute illness or injury that poses threat of life of bodily function  Additional Data Reviewed and Analyzed Further history obtained from: Further history from spouse/family member  Additional Factors Impacting ED Encounter Risk None  Barth Kirks. [place] Small, [place] [email or web address]  Final Clinical Impressions(s) / ED Diagnoses     ICD-10-CM   1. Bad headache  R51.9       ED Discharge Orders     None        Discharge Instructions Discussed with and Provided to Patient:     Discharge Instructions      You were evaluated in the Emergency Department and after careful evaluation, we did not find any emergent condition requiring admission or further testing in the hospital.  Your exam/testing today is overall reassuring.  No signs of bleeding or aneurysms.  Recommend continued use of Tylenol or Motrin for any lingering discomfort.  Recommend discussing your headaches with your primary care doctor if they were to continue.  Please return to the Emergency Department if you experience any worsening of your condition.   Thank you for allowing [place] to be a part of your care.       Maudie Flakes, MD 03/17/22 [phone]

## 2022-03-17 NOTE — Discharge Instructions (Signed)
You were evaluated in the Emergency Department and after careful evaluation, we did not find any emergent condition requiring admission or further testing in the hospital.  Your exam/testing today is overall reassuring.  No signs of bleeding or aneurysms.  Recommend continued use of Tylenol or Motrin for any lingering discomfort.  Recommend discussing your headaches with your primary care doctor if they were to continue.  Please return to the Emergency Department if you experience any worsening of your condition.   Thank you for allowing [place] to be a part of your care.

## 2022-03-20 ENCOUNTER — Telehealth: Payer: Self-pay

## 2022-03-20 NOTE — Transitions of Care (Post Inpatient/ED Visit) (Signed)
   03/20/2022  Name: VANN OKERLUND MRN: [id number] DOB: [birth year]  Today's TOC FU Call Status: Today's TOC FU Call Status:: Unsuccessful Call (2nd Attempt) Unsuccessful Call (2nd Attempt) Date: 03/20/22  Attempted to reach the patient regarding the most recent Inpatient/ED visit.  Follow Up Plan: No further outreach attempts will be made at this time. We have been unable to contact the patient.  Signature Juanda Crumble, [place] Direct Dial [phone]

## 2022-03-20 NOTE — Transitions of Care (Post Inpatient/ED Visit) (Signed)
   03/20/2022  Name: Isaiah Hicks MRN: [id number] DOB: 1980/01/16  Today's TOC FU Call Status: Today's TOC FU Call Status:: Unsuccessful Call (2nd Attempt) Unsuccessful Call (1st Attempt) Date: 03/17/22 Unsuccessful Call (2nd Attempt) Date: 03/20/22  Attempted to reach the patient regarding the most recent Inpatient/ED visit.  Follow Up Plan: No further outreach attempts will be made at this time. We have been unable to contact the patient.  Signature Juanda Crumble, [place] Direct Dial [phone]

## 2022-09-06 ENCOUNTER — Ambulatory Visit: Payer: 59 | Admitting: Family Medicine

## 2022-09-06 ENCOUNTER — Encounter: Payer: Self-pay | Admitting: Family Medicine

## 2022-09-06 VITALS — BP 134/80 | HR 48 | Temp 97.9°F | Ht 72.0 in | Wt 218.0 lb

## 2022-09-06 DIAGNOSIS — M7711 Lateral epicondylitis, right elbow: Secondary | ICD-10-CM

## 2022-09-06 DIAGNOSIS — I10 Essential (primary) hypertension: Secondary | ICD-10-CM

## 2022-09-06 DIAGNOSIS — M542 Cervicalgia: Secondary | ICD-10-CM

## 2022-09-06 DIAGNOSIS — Z79899 Other long term (current) drug therapy: Secondary | ICD-10-CM

## 2022-09-06 DIAGNOSIS — E7849 Other hyperlipidemia: Secondary | ICD-10-CM | POA: Diagnosis not present

## 2022-09-06 DIAGNOSIS — G5603 Carpal tunnel syndrome, bilateral upper limbs: Secondary | ICD-10-CM

## 2022-09-06 DIAGNOSIS — G5600 Carpal tunnel syndrome, unspecified upper limb: Secondary | ICD-10-CM

## 2022-09-06 DIAGNOSIS — G8929 Other chronic pain: Secondary | ICD-10-CM

## 2022-09-06 DIAGNOSIS — M545 Low back pain, unspecified: Secondary | ICD-10-CM | POA: Diagnosis not present

## 2022-09-06 MED ORDER — VALSARTAN 40 MG PO TABS: 40.0000 mg | ORAL_TABLET | Freq: Every day | ORAL | 1 refills | Status: DC

## 2022-09-06 MED ORDER — MELOXICAM 15 MG PO TABS: 15.0000 mg | ORAL_TABLET | Freq: Every day | ORAL | 1 refills | Status: DC

## 2022-09-06 MED ORDER — AMLODIPINE BESYLATE 5 MG PO TABS: ORAL_TABLET | ORAL | 2 refills | Status: DC

## 2022-09-06 NOTE — Progress Notes (Signed)
   Subjective:    Patient ID: Isaiah Hicks, male    DOB: 1980/02/06, 42 y.o.   MRN: [id number]  HPI Patient coming in for a 6 month follow up for hypertension, sleep apnea and epicondylitis of right elbow. Patient states sleep apnea has resolved. Patient states he has lower back pain and neck pain on and off.   Essential hypertension - Plan: Basic Metabolic Panel, Microalbumin/Creatinine Ratio, Urine  Lateral epicondylitis of right elbow - Plan: Ambulatory referral to Orthopedics  Lumbar pain  Other hyperlipidemia - Plan: Lipid Panel  Bilateral carpal tunnel syndrome  Chronic neck pain  High risk medication use - Plan: Hepatic Function Panel  Carpal tunnel syndrome, unspecified laterality - Plan: Ambulatory referral to Neurology  Patient relates lateral epicondylitis on the right elbow been present over 6 months no numbness or tingling Patient also has bilateral arm tingling he states it his arms just go to sleep happens on a regular basis.  Sometimes when he is driving sometimes when he sleeping he does have a history of neck troubles but his symptoms sound more like carpal tunnel  Patient with persistent right lower back pain does not radiate down the legs in the area of the sacroiliac region Review of Systems     Objective:   Physical Exam  General-in no acute distress Eyes-no discharge Lungs-respiratory rate normal, CTA CV-no murmurs,RRR Extremities skin warm dry no edema Neuro grossly normal Behavior normal, alert  Positive Tinel's negative Phalen's No weakness detected in the arms    Assessment & Plan:   Patient eating healthy exercising no longer has sleep apnea  Blood pressure diastolic runs in the mid 80s on a regular basis systolic in the low 130s to mid 130s We will go ahead and add valsartan 40 mg daily start off with half tablet daily for 4 days then 1 tablet thereafter Lab work in approximately 10 days  Lateral epicondylitis right elbow referral  to orthopedics  Probable carpal tunnel nerve conduction studies [place]  Chronic neck pain-currently patient would like to just put up with this uses meloxicam as needed

## 2022-09-06 NOTE — Progress Notes (Deleted)
   Subjective:    Patient ID: Isaiah Hicks, male    DOB: 1980/02/08, 42 y.o.   MRN: [id number]  HPI    Review of Systems     Objective:   Physical Exam        Assessment & Plan:

## 2022-10-06 ENCOUNTER — Ambulatory Visit: Payer: 59 | Admitting: Family Medicine

## 2023-04-11 ENCOUNTER — Ambulatory Visit: Admitting: Family Medicine

## 2023-04-11 VITALS — BP 122/82 | HR 60 | Temp 98.2°F | Ht 72.0 in | Wt 230.0 lb

## 2023-04-11 DIAGNOSIS — E782 Mixed hyperlipidemia: Secondary | ICD-10-CM | POA: Diagnosis not present

## 2023-04-11 DIAGNOSIS — Z79899 Other long term (current) drug therapy: Secondary | ICD-10-CM | POA: Diagnosis not present

## 2023-04-11 DIAGNOSIS — I10 Essential (primary) hypertension: Secondary | ICD-10-CM

## 2023-04-11 DIAGNOSIS — E7849 Other hyperlipidemia: Secondary | ICD-10-CM

## 2023-04-11 MED ORDER — MELOXICAM 15 MG PO TABS: 15.0000 mg | ORAL_TABLET | Freq: Every day | ORAL | 2 refills | Status: DC

## 2023-04-11 MED ORDER — AMLODIPINE BESYLATE 5 MG PO TABS: ORAL_TABLET | ORAL | 2 refills | Status: DC

## 2023-04-11 MED ORDER — VALSARTAN 40 MG PO TABS: 40.0000 mg | ORAL_TABLET | Freq: Every day | ORAL | 0 refills | Status: DC

## 2023-04-11 MED ORDER — VALSARTAN 40 MG PO TABS: 40.0000 mg | ORAL_TABLET | Freq: Every day | ORAL | 2 refills | Status: DC

## 2023-04-11 MED ORDER — MELOXICAM 15 MG PO TABS: 15.0000 mg | ORAL_TABLET | Freq: Every day | ORAL | 0 refills | Status: DC

## 2023-04-11 MED ORDER — AMLODIPINE BESYLATE 5 MG PO TABS: ORAL_TABLET | ORAL | 0 refills | Status: DC

## 2023-04-11 NOTE — Progress Notes (Signed)
   Subjective:    Patient ID: COYT GOVONI, male    DOB: June 26, 1980, 43 y.o.   MRN: [id number]  HPI HTN follow up out of valsartan 3 days HTN- patient seen for follow-up regarding HTN.   Diet, medication compliance, appropriate labs and refills were completed.   Importance of keeping blood pressure under good control to lessen the risk of complications discussed Regular follow-up visits discussed  Patient doing a very good job with diet and exercise. Very muscular This causes his BMI to be elevated I would not consider him obese.  Review of Systems     Objective:   Physical Exam General-in no acute distress Eyes-no discharge Lungs-respiratory rate normal, CTA CV-no murmurs,RRR Extremities skin warm dry no edema Neuro grossly normal Behavior normal, alert Blood pressure taken with proper cuff overall good       Assessment & Plan:   Meloxicam as needed for chronic knee pain back pain  HTN- patient seen for follow-up regarding HTN.   Diet, medication compliance, appropriate labs and refills were completed.   Importance of keeping blood pressure under good control to lessen the risk of complications discussed Regular follow-up visits discussed Patient will do lab work in the near future  Wellness exam 6 months

## 2023-05-19 ENCOUNTER — Other Ambulatory Visit: Payer: Self-pay | Admitting: Family Medicine

## 2023-05-21 ENCOUNTER — Telehealth: Payer: Self-pay

## 2023-05-21 NOTE — Telephone Encounter (Signed)
 Prescription Request  05/21/2023  LOV: Visit date not found  What is the name of the medication or equipment? amLODipine  (NORVASC ) 5 MG tablet   Have you contacted your pharmacy to request a refill? Yes   Which pharmacy would you like this sent to?    [facility]     Patient notified that their request is being sent to the clinical staff for review and that they should receive a response within 2 business days.   Please advise at Mobile [phone] (mobile)

## 2023-10-11 ENCOUNTER — Encounter: Payer: Self-pay | Admitting: Family Medicine

## 2023-10-11 ENCOUNTER — Ambulatory Visit: Admitting: Family Medicine

## 2023-10-11 VITALS — BP 124/80 | HR 64 | Temp 97.9°F | Ht 72.0 in

## 2023-10-11 DIAGNOSIS — I10 Essential (primary) hypertension: Secondary | ICD-10-CM

## 2023-10-11 DIAGNOSIS — Z79899 Other long term (current) drug therapy: Secondary | ICD-10-CM

## 2023-10-11 DIAGNOSIS — E7849 Other hyperlipidemia: Secondary | ICD-10-CM

## 2023-10-11 MED ORDER — MELOXICAM 15 MG PO TABS: 15.0000 mg | ORAL_TABLET | Freq: Every day | ORAL | 2 refills | Status: AC

## 2023-10-11 MED ORDER — AMLODIPINE BESYLATE 5 MG PO TABS: 5.0000 mg | ORAL_TABLET | Freq: Every day | ORAL | 2 refills | Status: AC

## 2023-10-11 MED ORDER — VALSARTAN 40 MG PO TABS: 40.0000 mg | ORAL_TABLET | Freq: Every day | ORAL | 2 refills | Status: AC

## 2023-10-11 NOTE — Progress Notes (Signed)
   Subjective:    Patient ID: Isaiah Hicks, male    DOB: Jul 28, 1980, 43 y.o.   MRN: [id number]  HPI 6 month follow up  No concerns voiced Patient taking blood pressure medicine regular basis Exercising eating healthy Keeping weight under reasonable control He is a muscular gentleman He is on testosterone supplementation by his urologist today follow-up blood work He denies any setbacks or problems recently Meloxicam  helps him with his lower back pain but does not do much for his neck pain. Handling stress well   Review of Systems     Objective:   Physical Exam General-in no acute distress Eyes-no discharge Lungs-respiratory rate normal, CTA CV-no murmurs,RRR Extremities skin warm dry no edema Neuro grossly normal Behavior normal, alert        Assessment & Plan:  1. Essential hypertension (Primary) Blood pressure good control Continue current medication Check lab work await results - Basic metabolic panel with GFR - Lipid Profile - Hepatic function panel - Urine Microalbumin w/creat. ratio  2. Other hyperlipidemia Check labs Healthy diet  - Basic metabolic panel with GFR - Lipid Profile - Hepatic function panel - Urine Microalbumin w/creat. ratio  3. High risk medication use Check liver function  - Basic metabolic panel with GFR - Lipid Profile - Hepatic function panel - Urine Microalbumin w/creat. ratio Continue testosterone with his specialist

## 2023-10-13 ENCOUNTER — Ambulatory Visit: Payer: Self-pay | Admitting: Family Medicine

## 2023-10-13 LAB — HEPATIC FUNCTION PANEL
ALT: 29 IU/L (ref 0–44)
AST: 28 IU/L (ref 0–40)
Albumin: 4.7 g/dL (ref 4.1–5.1)
Alkaline Phosphatase: 70 IU/L (ref 47–123)
Bilirubin Total: 0.6 mg/dL (ref 0.0–1.2)
Bilirubin, Direct: 0.17 mg/dL (ref 0.00–0.40)
Total Protein: 7.2 g/dL (ref 6.0–8.5)

## 2023-10-13 LAB — LIPID PANEL
Chol/HDL Ratio: 3.5 ratio (ref 0.0–5.0)
Cholesterol, Total: 198 mg/dL (ref 100–199)
HDL: 56 mg/dL (ref >39)
LDL Chol Calc (NIH): 123 mg/dL — ABNORMAL HIGH (ref 0–99)
Triglycerides: 109 mg/dL (ref 0–149)
VLDL Cholesterol Cal: 19 mg/dL (ref 5–40)

## 2023-10-13 LAB — BASIC METABOLIC PANEL WITH GFR
BUN/Creatinine Ratio: 13 (ref 9–20)
BUN: 12 mg/dL (ref 6–24)
CO2: 25 mmol/L (ref 20–29)
Calcium: 9.7 mg/dL (ref 8.7–10.2)
Chloride: 99 mmol/L (ref 96–106)
Creatinine, Ser: 0.95 mg/dL (ref 0.76–1.27)
Glucose: 84 mg/dL (ref 70–99)
Potassium: 4.2 mmol/L (ref 3.5–5.2)
Sodium: 140 mmol/L (ref 134–144)
eGFR: 102 mL/min/1.73 (ref >59)

## 2023-10-13 LAB — MICROALBUMIN / CREATININE URINE RATIO
Creatinine, Urine: 146.1 mg/dL
Microalb/Creat Ratio: 5 mg/g{creat} (ref 0–29)
Microalbumin, Urine: 7.2 ug/mL

## 2024-04-10 ENCOUNTER — Ambulatory Visit: Admitting: Family Medicine
